# Patient Record
Sex: Female | Born: 2000
Health system: Southern US, Community
[De-identification: ages and names within clinical notes are randomized; demographics above are authoritative.]

## PROBLEM LIST (undated history)

## (undated) ENCOUNTER — Inpatient Hospital Stay (HOSPITAL_COMMUNITY): Payer: Self-pay

## (undated) ENCOUNTER — Ambulatory Visit: Admission: EM | Payer: Commercial Managed Care - PPO | Source: Home / Self Care

## (undated) DIAGNOSIS — G43909 Migraine, unspecified, not intractable, without status migrainosus: Secondary | ICD-10-CM

## (undated) DIAGNOSIS — J45909 Unspecified asthma, uncomplicated: Secondary | ICD-10-CM

## (undated) HISTORY — PX: TONSILLECTOMY: SUR1361

## (undated) HISTORY — PX: BREAST REDUCTION SURGERY: SHX8

## (undated) HISTORY — DX: Migraine, unspecified, not intractable, without status migrainosus: G43.909

## (undated) HISTORY — DX: Unspecified asthma, uncomplicated: J45.909

## (undated) HISTORY — PX: TYMPANOSTOMY TUBE PLACEMENT: SHX32

---
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
## 2019-01-24 DIAGNOSIS — F418 Other specified anxiety disorders: Secondary | ICD-10-CM | POA: Diagnosis not present

## 2019-02-17 ENCOUNTER — Other Ambulatory Visit: Payer: Self-pay | Admitting: Internal Medicine

## 2019-02-17 DIAGNOSIS — Z20822 Contact with and (suspected) exposure to covid-19: Secondary | ICD-10-CM

## 2019-02-19 LAB — NOVEL CORONAVIRUS, NAA: SARS-CoV-2, NAA: DETECTED — AB

## 2019-03-22 MED FILL — SERTRALINE HCL 100 MG TAB: 100 | 30 days supply | Qty: 30 | Fill #0

## 2019-04-04 ENCOUNTER — Encounter (HOSPITAL_COMMUNITY): Payer: Self-pay | Admitting: Emergency Medicine

## 2019-04-04 ENCOUNTER — Emergency Department (HOSPITAL_COMMUNITY)
Admission: EM | Admit: 2019-04-04 | Discharge: 2019-04-05 | Discharge status: Against Medical Advice | Payer: 59 | Attending: Emergency Medicine | Admitting: Emergency Medicine

## 2019-04-04 ENCOUNTER — Other Ambulatory Visit: Payer: Self-pay

## 2019-04-04 DIAGNOSIS — Z5321 Procedure and treatment not carried out due to patient leaving prior to being seen by health care provider: Secondary | ICD-10-CM | POA: Insufficient documentation

## 2019-04-04 DIAGNOSIS — R42 Dizziness and giddiness: Secondary | ICD-10-CM | POA: Diagnosis present

## 2019-04-04 LAB — CBC
HCT: 44 % (ref 36.0–46.0)
Hemoglobin: 12.7 g/dL (ref 12.0–15.0)
MCH: 23.5 pg — ABNORMAL LOW (ref 26.0–34.0)
MCHC: 28.9 g/dL — ABNORMAL LOW (ref 30.0–36.0)
MCV: 81.3 fL (ref 80.0–100.0)
Platelets: 283 10*3/uL (ref 150–400)
RBC: 5.41 MIL/uL — ABNORMAL HIGH (ref 3.87–5.11)
RDW: 13.9 % (ref 11.5–15.5)
WBC: 5.5 10*3/uL (ref 4.0–10.5)
nRBC: 0 % (ref 0.0–0.2)

## 2019-04-04 LAB — I-STAT BETA HCG BLOOD, ED (MC, WL, AP ONLY): I-stat hCG, quantitative: <5 m[IU]/mL (ref <5)

## 2019-04-04 LAB — URINALYSIS, ROUTINE W REFLEX MICROSCOPIC
Bilirubin Urine: NEGATIVE
Glucose, UA: NEGATIVE mg/dL
Hgb urine dipstick: NEGATIVE
Ketones, ur: NEGATIVE mg/dL
Nitrite: NEGATIVE
Protein, ur: NEGATIVE mg/dL
Specific Gravity, Urine: 1.028 (ref 1.005–1.030)
pH: 5 (ref 5.0–8.0)

## 2019-04-04 LAB — BASIC METABOLIC PANEL
Anion gap: 10 (ref 5–15)
BUN: 13 mg/dL (ref 6–20)
CO2: 23 mmol/L (ref 22–32)
Calcium: 9.5 mg/dL (ref 8.9–10.3)
Chloride: 107 mmol/L (ref 98–111)
Creatinine, Ser: 0.84 mg/dL (ref 0.44–1.00)
GFR calc Af Amer: >60 mL/min (ref >60)
GFR calc non Af Amer: >60 mL/min (ref >60)
Glucose, Bld: 88 mg/dL (ref 70–99)
Potassium: 3.7 mmol/L (ref 3.5–5.1)
Sodium: 140 mmol/L (ref 135–145)

## 2019-04-04 MED ORDER — SODIUM CHLORIDE 0.9% FLUSH: 3.0000 mL | Freq: Once | INTRAVENOUS | Status: DC

## 2019-04-04 NOTE — ED Triage Notes (Signed)
Pt states immediately upon waking up this morning she felt dizzy and since then it has been coming and going. Pt is concerned also because over the last couple months she has episodes where her vision goes black and last about 1 minute and then comes back.     Pt had covid 1 month ago.

## 2019-04-05 DIAGNOSIS — R42 Dizziness and giddiness: Secondary | ICD-10-CM | POA: Diagnosis not present

## 2019-04-05 DIAGNOSIS — E348 Other specified endocrine disorders: Secondary | ICD-10-CM | POA: Diagnosis not present

## 2019-04-10 ENCOUNTER — Other Ambulatory Visit: Payer: Self-pay

## 2019-04-10 ENCOUNTER — Other Ambulatory Visit: Payer: Self-pay | Admitting: Family Medicine

## 2019-04-10 DIAGNOSIS — Z20822 Contact with and (suspected) exposure to covid-19: Secondary | ICD-10-CM

## 2019-04-10 DIAGNOSIS — E348 Other specified endocrine disorders: Secondary | ICD-10-CM

## 2019-04-11 LAB — NOVEL CORONAVIRUS, NAA: SARS-CoV-2, NAA: NOT DETECTED

## 2019-04-27 ENCOUNTER — Ambulatory Visit
Admission: RE | Admit: 2019-04-27 | Discharge: 2019-04-27 | Disposition: A | Discharge status: Home / Self Care | Payer: 59 | Source: Ambulatory Visit | Attending: Family Medicine | Admitting: Family Medicine

## 2019-04-27 DIAGNOSIS — R519 Headache, unspecified: Secondary | ICD-10-CM | POA: Diagnosis not present

## 2019-04-27 DIAGNOSIS — E348 Other specified endocrine disorders: Secondary | ICD-10-CM

## 2019-04-27 MED ORDER — GADOBENATE DIMEGLUMINE 529 MG/ML IV SOLN
15.0000 mL | Freq: Once | INTRAVENOUS | Status: AC | PRN
  Administered 2019-04-27: 15 mL via INTRAVENOUS

## 2019-08-02 DIAGNOSIS — J069 Acute upper respiratory infection, unspecified: Secondary | ICD-10-CM | POA: Diagnosis not present

## 2019-08-03 DIAGNOSIS — Z1152 Encounter for screening for COVID-19: Secondary | ICD-10-CM | POA: Diagnosis not present

## 2019-08-03 DIAGNOSIS — J069 Acute upper respiratory infection, unspecified: Secondary | ICD-10-CM | POA: Diagnosis not present

## 2019-08-21 MED FILL — SERTRALINE HCL 100 MG TAB: 100 | 30 days supply | Qty: 30 | Fill #1

## 2019-09-12 DIAGNOSIS — E669 Obesity, unspecified: Secondary | ICD-10-CM | POA: Diagnosis not present

## 2019-09-12 DIAGNOSIS — F330 Major depressive disorder, recurrent, mild: Secondary | ICD-10-CM | POA: Diagnosis not present

## 2019-09-12 DIAGNOSIS — J040 Acute laryngitis: Secondary | ICD-10-CM | POA: Diagnosis not present

## 2019-10-06 DIAGNOSIS — J019 Acute sinusitis, unspecified: Secondary | ICD-10-CM | POA: Diagnosis not present

## 2019-10-06 MED FILL — ALBUTEROL SULFATE HFA 108 (: 108 (90 BAS | 17 days supply | Qty: 9 | Fill #0

## 2019-10-06 MED FILL — FLUTICASONE PROP 50 MCG SPR: 50 | 30 days supply | Qty: 16 | Fill #0

## 2019-10-11 MED FILL — AMOXICILLIN 875 MG TABS: 875 | 5 days supply | Qty: 10 | Fill #0

## 2019-11-02 ENCOUNTER — Ambulatory Visit: Payer: 59 | Attending: Internal Medicine

## 2019-11-02 DIAGNOSIS — Z20822 Contact with and (suspected) exposure to covid-19: Secondary | ICD-10-CM

## 2019-11-03 LAB — SARS-COV-2, NAA 2 DAY TAT

## 2019-11-03 LAB — NOVEL CORONAVIRUS, NAA: SARS-CoV-2, NAA: NOT DETECTED

## 2019-12-16 DIAGNOSIS — Z20822 Contact with and (suspected) exposure to covid-19: Secondary | ICD-10-CM | POA: Diagnosis not present

## 2020-01-19 ENCOUNTER — Other Ambulatory Visit: Payer: Self-pay

## 2020-01-19 ENCOUNTER — Other Ambulatory Visit: Payer: 59

## 2020-01-19 DIAGNOSIS — Z20822 Contact with and (suspected) exposure to covid-19: Secondary | ICD-10-CM

## 2020-01-22 LAB — NOVEL CORONAVIRUS, NAA: SARS-CoV-2, NAA: NOT DETECTED

## 2020-01-26 DIAGNOSIS — B373 Candidiasis of vulva and vagina: Secondary | ICD-10-CM | POA: Diagnosis not present

## 2020-01-26 MED FILL — FLUCONAZOLE 150 MG TABS: 150 | 6 days supply | Qty: 2 | Fill #0

## 2020-02-16 DIAGNOSIS — Z01419 Encounter for gynecological examination (general) (routine) without abnormal findings: Secondary | ICD-10-CM | POA: Diagnosis not present

## 2020-02-16 DIAGNOSIS — Z113 Encounter for screening for infections with a predominantly sexual mode of transmission: Secondary | ICD-10-CM | POA: Diagnosis not present

## 2020-02-16 DIAGNOSIS — Z3040 Encounter for surveillance of contraceptives, unspecified: Secondary | ICD-10-CM | POA: Diagnosis not present

## 2020-05-15 ENCOUNTER — Other Ambulatory Visit: Payer: 59

## 2020-05-15 DIAGNOSIS — Z20822 Contact with and (suspected) exposure to covid-19: Secondary | ICD-10-CM

## 2020-05-16 LAB — SARS-COV-2, NAA 2 DAY TAT

## 2020-05-16 LAB — NOVEL CORONAVIRUS, NAA: SARS-CoV-2, NAA: DETECTED — AB

## 2020-06-20 ENCOUNTER — Ambulatory Visit: Payer: 59 | Attending: Internal Medicine

## 2020-06-20 DIAGNOSIS — Z23 Encounter for immunization: Secondary | ICD-10-CM

## 2020-06-20 NOTE — Progress Notes (Signed)
   Covid-[facility] Vaccination [facility]  Name:  [name]    MRN: [id number] DOB: [birth year]  06/20/2020  [name] was observed post Covid-19 immunization for 15 minutes without incident. She was provided with Vaccine Information Sheet and instruction to access the V-Safe system.   [name] was instructed to call 911 with any severe reactions post vaccine: Marland Kitchen Difficulty breathing  . Swelling of face and throat  . A fast heartbeat  . A bad rash all over body  . Dizziness and weakness   Immunizations Administered    Name Date Dose VIS Date Route   Moderna COVID-19 Vaccine 06/20/2020  3:13 PM 0.5 mL 02/28/2020 Intramuscular   Manufacturer: Moderna   Lot: [id number]   NDC: 73532-992-42

## 2020-07-10 DIAGNOSIS — J029 Acute pharyngitis, unspecified: Secondary | ICD-10-CM | POA: Diagnosis not present

## 2020-07-10 DIAGNOSIS — R0981 Nasal congestion: Secondary | ICD-10-CM | POA: Diagnosis not present

## 2020-07-10 DIAGNOSIS — R059 Cough, unspecified: Secondary | ICD-10-CM | POA: Diagnosis not present

## 2020-07-10 DIAGNOSIS — R509 Fever, unspecified: Secondary | ICD-10-CM | POA: Diagnosis not present

## 2020-07-19 ENCOUNTER — Ambulatory Visit: Payer: 59

## 2020-08-14 ENCOUNTER — Other Ambulatory Visit (HOSPITAL_COMMUNITY): Payer: Self-pay

## 2020-08-14 DIAGNOSIS — Z3040 Encounter for surveillance of contraceptives, unspecified: Secondary | ICD-10-CM | POA: Diagnosis not present

## 2020-08-14 DIAGNOSIS — Z332 Encounter for elective termination of pregnancy: Secondary | ICD-10-CM | POA: Diagnosis not present

## 2020-08-14 MED ORDER — DROSPIRENONE-ETHINYL ESTRADIOL 3-0.02 MG PO TABS
1.0000 | ORAL_TABLET | Freq: Every day | ORAL | 4 refills | Status: DC
  Filled 2020-08-14 – 2020-09-05 (×2): qty 84, 84d supply, fill #0

## 2020-08-27 ENCOUNTER — Other Ambulatory Visit (HOSPITAL_COMMUNITY): Payer: Self-pay

## 2020-09-05 ENCOUNTER — Other Ambulatory Visit (HOSPITAL_COMMUNITY): Payer: Self-pay

## 2020-10-02 ENCOUNTER — Other Ambulatory Visit (HOSPITAL_COMMUNITY): Payer: Self-pay

## 2020-10-02 DIAGNOSIS — J069 Acute upper respiratory infection, unspecified: Secondary | ICD-10-CM | POA: Diagnosis not present

## 2020-10-02 DIAGNOSIS — F332 Major depressive disorder, recurrent severe without psychotic features: Secondary | ICD-10-CM | POA: Diagnosis not present

## 2020-10-02 MED ORDER — SERTRALINE HCL 100 MG PO TABS
ORAL_TABLET | Freq: Every day | ORAL | 4 refills | Status: DC
  Filled 2020-10-02: qty 90, 90d supply, fill #0

## 2020-11-21 ENCOUNTER — Other Ambulatory Visit (HOSPITAL_COMMUNITY): Payer: Self-pay

## 2020-11-21 DIAGNOSIS — F4320 Adjustment disorder, unspecified: Secondary | ICD-10-CM | POA: Diagnosis not present

## 2020-11-21 DIAGNOSIS — Z3040 Encounter for surveillance of contraceptives, unspecified: Secondary | ICD-10-CM | POA: Diagnosis not present

## 2020-11-21 MED ORDER — SERTRALINE HCL 100 MG PO TABS: 100.0000 mg | ORAL_TABLET | Freq: Every day | ORAL | 4 refills | Status: DC

## 2020-11-21 MED ORDER — DROSPIRENONE-ETHINYL ESTRADIOL 3-0.02 MG PO TABS
1.0000 | ORAL_TABLET | Freq: Every day | ORAL | 4 refills | Status: DC
  Filled 2020-11-21: qty 84, 84d supply, fill #0
  Filled 2021-05-19: qty 84, 84d supply, fill #1

## 2021-01-16 ENCOUNTER — Other Ambulatory Visit (HOSPITAL_COMMUNITY): Payer: Self-pay

## 2021-01-25 DIAGNOSIS — Z20822 Contact with and (suspected) exposure to covid-19: Secondary | ICD-10-CM | POA: Diagnosis not present

## 2021-02-04 ENCOUNTER — Other Ambulatory Visit (HOSPITAL_COMMUNITY): Payer: Self-pay

## 2021-02-04 ENCOUNTER — Encounter (HOSPITAL_BASED_OUTPATIENT_CLINIC_OR_DEPARTMENT_OTHER): Payer: Self-pay | Admitting: Emergency Medicine

## 2021-02-04 ENCOUNTER — Emergency Department (HOSPITAL_BASED_OUTPATIENT_CLINIC_OR_DEPARTMENT_OTHER): Payer: 59

## 2021-02-04 ENCOUNTER — Other Ambulatory Visit: Payer: Self-pay

## 2021-02-04 ENCOUNTER — Emergency Department (HOSPITAL_BASED_OUTPATIENT_CLINIC_OR_DEPARTMENT_OTHER)
Admission: EM | Admit: 2021-02-04 | Discharge: 2021-02-04 | Disposition: A | Discharge status: Home / Self Care | Payer: 59 | Attending: Emergency Medicine | Admitting: Emergency Medicine

## 2021-02-04 DIAGNOSIS — M542 Cervicalgia: Secondary | ICD-10-CM | POA: Insufficient documentation

## 2021-02-04 DIAGNOSIS — K760 Fatty (change of) liver, not elsewhere classified: Secondary | ICD-10-CM | POA: Diagnosis not present

## 2021-02-04 DIAGNOSIS — Y92410 Unspecified street and highway as the place of occurrence of the external cause: Secondary | ICD-10-CM | POA: Insufficient documentation

## 2021-02-04 DIAGNOSIS — R1032 Left lower quadrant pain: Secondary | ICD-10-CM | POA: Diagnosis not present

## 2021-02-04 DIAGNOSIS — R9431 Abnormal electrocardiogram [ECG] [EKG]: Secondary | ICD-10-CM | POA: Diagnosis not present

## 2021-02-04 DIAGNOSIS — S299XXA Unspecified injury of thorax, initial encounter: Secondary | ICD-10-CM | POA: Diagnosis not present

## 2021-02-04 DIAGNOSIS — R519 Headache, unspecified: Secondary | ICD-10-CM | POA: Insufficient documentation

## 2021-02-04 DIAGNOSIS — R0789 Other chest pain: Secondary | ICD-10-CM | POA: Insufficient documentation

## 2021-02-04 DIAGNOSIS — S3991XA Unspecified injury of abdomen, initial encounter: Secondary | ICD-10-CM | POA: Diagnosis not present

## 2021-02-04 DIAGNOSIS — S30811A Abrasion of abdominal wall, initial encounter: Secondary | ICD-10-CM | POA: Diagnosis not present

## 2021-02-04 DIAGNOSIS — S20219A Contusion of unspecified front wall of thorax, initial encounter: Secondary | ICD-10-CM | POA: Insufficient documentation

## 2021-02-04 DIAGNOSIS — M545 Low back pain, unspecified: Secondary | ICD-10-CM | POA: Insufficient documentation

## 2021-02-04 DIAGNOSIS — S27329A Contusion of lung, unspecified, initial encounter: Secondary | ICD-10-CM | POA: Insufficient documentation

## 2021-02-04 DIAGNOSIS — S27309A Unspecified injury of lung, unspecified, initial encounter: Secondary | ICD-10-CM | POA: Diagnosis present

## 2021-02-04 DIAGNOSIS — R918 Other nonspecific abnormal finding of lung field: Secondary | ICD-10-CM | POA: Diagnosis not present

## 2021-02-04 DIAGNOSIS — R109 Unspecified abdominal pain: Secondary | ICD-10-CM

## 2021-02-04 DIAGNOSIS — R0689 Other abnormalities of breathing: Secondary | ICD-10-CM | POA: Diagnosis not present

## 2021-02-04 DIAGNOSIS — S20212A Contusion of left front wall of thorax, initial encounter: Secondary | ICD-10-CM | POA: Diagnosis not present

## 2021-02-04 DIAGNOSIS — R079 Chest pain, unspecified: Secondary | ICD-10-CM | POA: Diagnosis not present

## 2021-02-04 LAB — CBC WITH DIFFERENTIAL/PLATELET
Abs Immature Granulocytes: 0.17 10*3/uL — ABNORMAL HIGH (ref 0.00–0.07)
Basophils Absolute: 0.1 10*3/uL (ref 0.0–0.1)
Basophils Relative: 0 %
Eosinophils Absolute: 0 10*3/uL (ref 0.0–0.5)
Eosinophils Relative: 0 %
HCT: 39.9 % (ref 36.0–46.0)
Hemoglobin: 12.3 g/dL (ref 12.0–15.0)
Immature Granulocytes: 1 %
Lymphocytes Relative: 6 %
Lymphs Abs: 1.4 10*3/uL (ref 0.7–4.0)
MCH: 23.7 pg — ABNORMAL LOW (ref 26.0–34.0)
MCHC: 30.8 g/dL (ref 30.0–36.0)
MCV: 76.9 fL — ABNORMAL LOW (ref 80.0–100.0)
Monocytes Absolute: 0.9 10*3/uL (ref 0.1–1.0)
Monocytes Relative: 4 %
Neutro Abs: 19.6 10*3/uL — ABNORMAL HIGH (ref 1.7–7.7)
Neutrophils Relative %: 89 %
Platelets: 338 10*3/uL (ref 150–400)
RBC: 5.19 MIL/uL — ABNORMAL HIGH (ref 3.87–5.11)
RDW: 13.6 % (ref 11.5–15.5)
WBC: 22.1 10*3/uL — ABNORMAL HIGH (ref 4.0–10.5)
nRBC: 0 % (ref 0.0–0.2)

## 2021-02-04 LAB — BASIC METABOLIC PANEL
Anion gap: 9 (ref 5–15)
BUN: 12 mg/dL (ref 6–20)
CO2: 21 mmol/L — ABNORMAL LOW (ref 22–32)
Calcium: 8.9 mg/dL (ref 8.9–10.3)
Chloride: 107 mmol/L (ref 98–111)
Creatinine, Ser: 0.69 mg/dL (ref 0.44–1.00)
GFR, Estimated: >60 mL/min (ref >60)
Glucose, Bld: 98 mg/dL (ref 70–99)
Potassium: 3.4 mmol/L — ABNORMAL LOW (ref 3.5–5.1)
Sodium: 137 mmol/L (ref 135–145)

## 2021-02-04 LAB — HCG, SERUM, QUALITATIVE: Preg, Serum: NEGATIVE

## 2021-02-04 MED ORDER — METHOCARBAMOL 500 MG PO TABS
500.0000 mg | ORAL_TABLET | Freq: Three times a day (TID) | ORAL | 0 refills | Status: DC | PRN
  Filled 2021-02-04: qty 20, 7d supply, fill #0

## 2021-02-04 MED ORDER — ONDANSETRON HCL 4 MG/2ML IJ SOLN
4.0000 mg | Freq: Once | INTRAMUSCULAR | Status: AC
  Administered 2021-02-04: 4 mg via INTRAVENOUS
  Filled 2021-02-04: qty 2

## 2021-02-04 MED ORDER — KETOROLAC TROMETHAMINE 15 MG/ML IJ SOLN
15.0000 mg | Freq: Once | INTRAMUSCULAR | Status: AC
  Administered 2021-02-04: 15 mg via INTRAVENOUS

## 2021-02-04 MED ORDER — IOHEXOL 350 MG/ML SOLN
100.0000 mL | Freq: Once | INTRAVENOUS | Status: AC | PRN
  Administered 2021-02-04: 85 mL via INTRAVENOUS

## 2021-02-04 MED ORDER — IBUPROFEN 600 MG PO TABS
600.0000 mg | ORAL_TABLET | Freq: Four times a day (QID) | ORAL | 0 refills | Status: DC | PRN
  Filled 2021-02-04: qty 30, 8d supply, fill #0

## 2021-02-04 MED ORDER — OXYCODONE-ACETAMINOPHEN 5-325 MG PO TABS
1.0000 | ORAL_TABLET | Freq: Four times a day (QID) | ORAL | 0 refills | Status: DC | PRN
  Filled 2021-02-04: qty 12, 3d supply, fill #0

## 2021-02-04 MED ORDER — FENTANYL CITRATE PF 50 MCG/ML IJ SOSY
50.0000 ug | PREFILLED_SYRINGE | Freq: Once | INTRAMUSCULAR | Status: AC
  Administered 2021-02-04: 50 ug via INTRAVENOUS
  Filled 2021-02-04: qty 1

## 2021-02-04 MED ORDER — KETOROLAC TROMETHAMINE 30 MG/ML IJ SOLN
30.0000 mg | Freq: Once | INTRAMUSCULAR | Status: DC
  Filled 2021-02-04: qty 1

## 2021-02-04 NOTE — ED Notes (Signed)
Taken to CT at this time. 

## 2021-02-04 NOTE — Discharge Instructions (Addendum)
Recommend recheck with your primary doctor in 24 to 48 hours.  Please use the incentive spirometer as instructed.  Recommend multimodal pain control as discussed.  Note the Percocet can make you drowsy should not be taken when driving or operating heavy machinery.  This also contains tylenol.  If you develop any worsening abdominal pain, any difficulty in breathing, come back to ER for reassessment.
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
## 2021-02-05 ENCOUNTER — Other Ambulatory Visit (HOSPITAL_COMMUNITY): Payer: Self-pay

## 2021-02-05 DIAGNOSIS — S20212D Contusion of left front wall of thorax, subsequent encounter: Secondary | ICD-10-CM | POA: Diagnosis not present

## 2021-02-05 DIAGNOSIS — S301XXD Contusion of abdominal wall, subsequent encounter: Secondary | ICD-10-CM | POA: Diagnosis not present

## 2021-02-05 DIAGNOSIS — F418 Other specified anxiety disorders: Secondary | ICD-10-CM | POA: Diagnosis not present

## 2021-02-05 DIAGNOSIS — S27329D Contusion of lung, unspecified, subsequent encounter: Secondary | ICD-10-CM | POA: Diagnosis not present

## 2021-02-05 DIAGNOSIS — S060X0D Concussion without loss of consciousness, subsequent encounter: Secondary | ICD-10-CM | POA: Diagnosis not present

## 2021-02-05 MED ORDER — BUSPIRONE HCL 10 MG PO TABS
10.0000 mg | ORAL_TABLET | Freq: Two times a day (BID) | ORAL | 2 refills | Status: DC | PRN
  Filled 2021-02-05: qty 60, 30d supply, fill #0

## 2021-02-14 DIAGNOSIS — R350 Frequency of micturition: Secondary | ICD-10-CM | POA: Diagnosis not present

## 2021-02-14 DIAGNOSIS — R1030 Lower abdominal pain, unspecified: Secondary | ICD-10-CM | POA: Diagnosis not present

## 2021-02-17 ENCOUNTER — Other Ambulatory Visit (HOSPITAL_COMMUNITY): Payer: Self-pay

## 2021-02-17 DIAGNOSIS — N939 Abnormal uterine and vaginal bleeding, unspecified: Secondary | ICD-10-CM | POA: Diagnosis not present

## 2021-02-17 DIAGNOSIS — R102 Pelvic and perineal pain: Secondary | ICD-10-CM | POA: Diagnosis not present

## 2021-02-17 DIAGNOSIS — Z3040 Encounter for surveillance of contraceptives, unspecified: Secondary | ICD-10-CM | POA: Diagnosis not present

## 2021-02-17 MED ORDER — DROSPIRENONE-ETHINYL ESTRADIOL 3-0.02 MG PO TABS
1.0000 | ORAL_TABLET | Freq: Every day | ORAL | 4 refills | Status: DC
  Filled 2021-02-17: qty 84, 84d supply, fill #0
  Filled 2021-08-11: qty 84, 84d supply, fill #1
  Filled 2021-10-27: qty 84, 84d supply, fill #2

## 2021-02-19 ENCOUNTER — Other Ambulatory Visit (HOSPITAL_COMMUNITY): Payer: Self-pay

## 2021-02-19 DIAGNOSIS — H6690 Otitis media, unspecified, unspecified ear: Secondary | ICD-10-CM | POA: Diagnosis not present

## 2021-02-19 DIAGNOSIS — N3000 Acute cystitis without hematuria: Secondary | ICD-10-CM | POA: Diagnosis not present

## 2021-02-19 DIAGNOSIS — Z03818 Encounter for observation for suspected exposure to other biological agents ruled out: Secondary | ICD-10-CM | POA: Diagnosis not present

## 2021-02-19 MED ORDER — NITROFURANTOIN MONOHYD MACRO 100 MG PO CAPS
100.0000 mg | ORAL_CAPSULE | Freq: Two times a day (BID) | ORAL | 0 refills | Status: DC
  Filled 2021-02-19: qty 10, 5d supply, fill #0

## 2021-02-19 MED ORDER — AMOXICILLIN-POT CLAVULANATE 875-125 MG PO TABS
1.0000 | ORAL_TABLET | Freq: Two times a day (BID) | ORAL | 0 refills | Status: DC
  Filled 2021-02-19: qty 20, 10d supply, fill #0

## 2021-04-08 ENCOUNTER — Other Ambulatory Visit (HOSPITAL_COMMUNITY): Payer: Self-pay

## 2021-04-08 DIAGNOSIS — R0981 Nasal congestion: Secondary | ICD-10-CM | POA: Diagnosis not present

## 2021-04-08 DIAGNOSIS — J45909 Unspecified asthma, uncomplicated: Secondary | ICD-10-CM | POA: Diagnosis not present

## 2021-04-08 DIAGNOSIS — R051 Acute cough: Secondary | ICD-10-CM | POA: Diagnosis not present

## 2021-04-08 DIAGNOSIS — R52 Pain, unspecified: Secondary | ICD-10-CM | POA: Diagnosis not present

## 2021-04-08 DIAGNOSIS — R6883 Chills (without fever): Secondary | ICD-10-CM | POA: Diagnosis not present

## 2021-04-08 DIAGNOSIS — Z03818 Encounter for observation for suspected exposure to other biological agents ruled out: Secondary | ICD-10-CM | POA: Diagnosis not present

## 2021-04-08 MED ORDER — AZITHROMYCIN 250 MG PO TABS
ORAL_TABLET | ORAL | 0 refills | Status: DC
  Filled 2021-04-08: qty 6, 5d supply, fill #0

## 2021-04-08 MED ORDER — PREDNISONE 5 MG PO TABS
ORAL_TABLET | ORAL | 0 refills | Status: DC
  Filled 2021-04-08: qty 21, 6d supply, fill #0

## 2021-05-19 ENCOUNTER — Other Ambulatory Visit (HOSPITAL_COMMUNITY): Payer: Self-pay

## 2021-08-11 ENCOUNTER — Other Ambulatory Visit (HOSPITAL_COMMUNITY): Payer: Self-pay

## 2021-09-08 ENCOUNTER — Other Ambulatory Visit (HOSPITAL_COMMUNITY): Payer: Self-pay

## 2021-09-30 DIAGNOSIS — Z3201 Encounter for pregnancy test, result positive: Secondary | ICD-10-CM | POA: Diagnosis not present

## 2021-09-30 DIAGNOSIS — Z3202 Encounter for pregnancy test, result negative: Secondary | ICD-10-CM | POA: Diagnosis not present

## 2021-09-30 DIAGNOSIS — N939 Abnormal uterine and vaginal bleeding, unspecified: Secondary | ICD-10-CM | POA: Diagnosis not present

## 2021-09-30 DIAGNOSIS — D649 Anemia, unspecified: Secondary | ICD-10-CM | POA: Diagnosis not present

## 2021-10-27 ENCOUNTER — Other Ambulatory Visit (HOSPITAL_COMMUNITY): Payer: Self-pay
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
## 2021-12-11 ENCOUNTER — Other Ambulatory Visit (HOSPITAL_COMMUNITY): Payer: Self-pay

## 2021-12-11 DIAGNOSIS — N898 Other specified noninflammatory disorders of vagina: Secondary | ICD-10-CM | POA: Diagnosis not present

## 2021-12-11 DIAGNOSIS — N751 Abscess of Bartholin's gland: Secondary | ICD-10-CM | POA: Insufficient documentation

## 2021-12-11 DIAGNOSIS — Z113 Encounter for screening for infections with a predominantly sexual mode of transmission: Secondary | ICD-10-CM | POA: Diagnosis not present

## 2021-12-11 DIAGNOSIS — N9489 Other specified conditions associated with female genital organs and menstrual cycle: Secondary | ICD-10-CM | POA: Diagnosis not present

## 2021-12-11 MED ORDER — SULFAMETHOXAZOLE-TRIMETHOPRIM 800-160 MG PO TABS
1.0000 | ORAL_TABLET | Freq: Two times a day (BID) | ORAL | 0 refills | Status: AC
  Filled 2021-12-11: qty 14, 7d supply, fill #0

## 2021-12-15 DIAGNOSIS — A549 Gonococcal infection, unspecified: Secondary | ICD-10-CM | POA: Insufficient documentation

## 2021-12-16 DIAGNOSIS — Z113 Encounter for screening for infections with a predominantly sexual mode of transmission: Secondary | ICD-10-CM | POA: Diagnosis not present

## 2022-01-20 ENCOUNTER — Other Ambulatory Visit (HOSPITAL_COMMUNITY): Payer: Self-pay

## 2022-01-20 DIAGNOSIS — N946 Dysmenorrhea, unspecified: Secondary | ICD-10-CM | POA: Diagnosis not present

## 2022-01-20 DIAGNOSIS — R300 Dysuria: Secondary | ICD-10-CM | POA: Diagnosis not present

## 2022-01-20 MED ORDER — NITROFURANTOIN MONOHYD MACRO 100 MG PO CAPS
100.0000 mg | ORAL_CAPSULE | Freq: Two times a day (BID) | ORAL | 0 refills | Status: AC
  Filled 2022-01-20: qty 10, 5d supply, fill #0

## 2022-01-21 DIAGNOSIS — N939 Abnormal uterine and vaginal bleeding, unspecified: Secondary | ICD-10-CM | POA: Diagnosis not present

## 2022-01-21 DIAGNOSIS — R300 Dysuria: Secondary | ICD-10-CM | POA: Diagnosis not present

## 2022-01-21 DIAGNOSIS — Z113 Encounter for screening for infections with a predominantly sexual mode of transmission: Secondary | ICD-10-CM | POA: Diagnosis not present

## 2022-01-26 DIAGNOSIS — O039 Complete or unspecified spontaneous abortion without complication: Secondary | ICD-10-CM | POA: Diagnosis not present

## 2022-01-26 DIAGNOSIS — N939 Abnormal uterine and vaginal bleeding, unspecified: Secondary | ICD-10-CM | POA: Diagnosis not present

## 2022-01-26 DIAGNOSIS — R102 Pelvic and perineal pain: Secondary | ICD-10-CM | POA: Diagnosis not present

## 2022-01-30 DIAGNOSIS — O039 Complete or unspecified spontaneous abortion without complication: Secondary | ICD-10-CM | POA: Diagnosis not present

## 2022-03-24 ENCOUNTER — Other Ambulatory Visit (HOSPITAL_COMMUNITY): Payer: Self-pay

## 2022-03-24 DIAGNOSIS — Z03818 Encounter for observation for suspected exposure to other biological agents ruled out: Secondary | ICD-10-CM | POA: Diagnosis not present

## 2022-03-24 DIAGNOSIS — R051 Acute cough: Secondary | ICD-10-CM | POA: Diagnosis not present

## 2022-03-24 DIAGNOSIS — J069 Acute upper respiratory infection, unspecified: Secondary | ICD-10-CM | POA: Diagnosis not present

## 2022-03-24 MED ORDER — ALBUTEROL SULFATE HFA 108 (90 BASE) MCG/ACT IN AERS
2.0000 | INHALATION_SPRAY | RESPIRATORY_TRACT | 0 refills | Status: DC
  Filled 2022-03-24: qty 6.7, 17d supply, fill #0

## 2022-03-24 MED ORDER — BENZONATATE 100 MG PO CAPS
100.0000 mg | ORAL_CAPSULE | Freq: Three times a day (TID) | ORAL | 0 refills | Status: DC
  Filled 2022-03-24: qty 15, 5d supply, fill #0

## 2022-03-25 ENCOUNTER — Other Ambulatory Visit (HOSPITAL_COMMUNITY): Payer: Self-pay

## 2022-03-25 MED ORDER — DOXYCYCLINE HYCLATE 100 MG PO CAPS
100.0000 mg | ORAL_CAPSULE | Freq: Two times a day (BID) | ORAL | 0 refills | Status: DC
  Filled 2022-03-25: qty 20, 10d supply, fill #0

## 2022-04-16 ENCOUNTER — Other Ambulatory Visit (HOSPITAL_COMMUNITY): Payer: Self-pay

## 2022-04-16 ENCOUNTER — Other Ambulatory Visit: Payer: Self-pay | Admitting: Physician Assistant

## 2022-04-16 DIAGNOSIS — J45909 Unspecified asthma, uncomplicated: Secondary | ICD-10-CM | POA: Diagnosis not present

## 2022-04-16 DIAGNOSIS — H538 Other visual disturbances: Secondary | ICD-10-CM | POA: Diagnosis not present

## 2022-04-16 DIAGNOSIS — R519 Headache, unspecified: Secondary | ICD-10-CM

## 2022-04-16 DIAGNOSIS — E348 Other specified endocrine disorders: Secondary | ICD-10-CM | POA: Diagnosis not present

## 2022-04-16 MED ORDER — PREDNISONE 5 MG PO TABS
ORAL_TABLET | ORAL | 0 refills | Status: DC
  Filled 2022-04-16: qty 21, 6d supply, fill #0

## 2022-09-03 DIAGNOSIS — R300 Dysuria: Secondary | ICD-10-CM | POA: Diagnosis not present

## 2022-09-03 DIAGNOSIS — Z113 Encounter for screening for infections with a predominantly sexual mode of transmission: Secondary | ICD-10-CM | POA: Diagnosis not present

## 2022-09-03 DIAGNOSIS — R102 Pelvic and perineal pain: Secondary | ICD-10-CM | POA: Diagnosis not present

## 2022-09-08 ENCOUNTER — Other Ambulatory Visit (HOSPITAL_COMMUNITY): Payer: Self-pay

## 2022-09-08 DIAGNOSIS — H6691 Otitis media, unspecified, right ear: Secondary | ICD-10-CM | POA: Diagnosis not present

## 2022-09-08 DIAGNOSIS — R5383 Other fatigue: Secondary | ICD-10-CM | POA: Diagnosis not present

## 2022-09-08 MED ORDER — AMOXICILLIN-POT CLAVULANATE 875-125 MG PO TABS
1.0000 | ORAL_TABLET | Freq: Two times a day (BID) | ORAL | 0 refills | Status: DC
  Filled 2022-09-08: qty 20, 10d supply, fill #0

## 2022-09-10 ENCOUNTER — Other Ambulatory Visit (HOSPITAL_COMMUNITY): Payer: Self-pay

## 2022-09-10 ENCOUNTER — Other Ambulatory Visit: Payer: Self-pay

## 2022-09-10 DIAGNOSIS — H6691 Otitis media, unspecified, right ear: Secondary | ICD-10-CM | POA: Diagnosis not present

## 2022-09-10 MED ORDER — PREDNISONE 5 MG (21) PO TBPK
ORAL_TABLET | ORAL | 0 refills | Status: DC
  Filled 2022-09-10: qty 21, 6d supply, fill #0

## 2022-09-10 MED ORDER — AZITHROMYCIN 250 MG PO TABS
ORAL_TABLET | ORAL | 0 refills | Status: DC
  Filled 2022-09-10: qty 6, 5d supply, fill #0

## 2022-09-15 DIAGNOSIS — H6691 Otitis media, unspecified, right ear: Secondary | ICD-10-CM | POA: Diagnosis not present

## 2022-09-17 DIAGNOSIS — N809 Endometriosis, unspecified: Secondary | ICD-10-CM | POA: Diagnosis not present

## 2022-09-17 DIAGNOSIS — R102 Pelvic and perineal pain: Secondary | ICD-10-CM | POA: Diagnosis not present

## 2022-09-17 DIAGNOSIS — N940 Mittelschmerz: Secondary | ICD-10-CM | POA: Diagnosis not present

## 2022-11-26 ENCOUNTER — Encounter: Payer: Self-pay | Admitting: Physician Assistant

## 2022-11-26 ENCOUNTER — Other Ambulatory Visit (HOSPITAL_COMMUNITY): Payer: Self-pay

## 2022-11-26 ENCOUNTER — Other Ambulatory Visit: Payer: Self-pay | Admitting: Internal Medicine

## 2022-11-26 ENCOUNTER — Ambulatory Visit
Admission: RE | Admit: 2022-11-26 | Discharge: 2022-11-26 | Disposition: A | Discharge status: Home / Self Care | Payer: Commercial Managed Care - PPO | Source: Ambulatory Visit | Attending: Internal Medicine | Admitting: Internal Medicine

## 2022-11-26 DIAGNOSIS — J069 Acute upper respiratory infection, unspecified: Secondary | ICD-10-CM

## 2022-11-26 DIAGNOSIS — Z03818 Encounter for observation for suspected exposure to other biological agents ruled out: Secondary | ICD-10-CM | POA: Diagnosis not present

## 2022-11-26 MED ORDER — ALBUTEROL SULFATE HFA 108 (90 BASE) MCG/ACT IN AERS
2.0000 | INHALATION_SPRAY | RESPIRATORY_TRACT | 0 refills | Status: DC | PRN
  Filled 2022-11-26: qty 6.7, 17d supply, fill #0

## 2022-11-26 MED ORDER — AMOXICILLIN-POT CLAVULANATE 875-125 MG PO TABS
1.0000 | ORAL_TABLET | Freq: Two times a day (BID) | ORAL | 0 refills | Status: AC
  Filled 2022-11-26: qty 14, 7d supply, fill #0

## 2022-11-26 MED ORDER — PROMETHAZINE HCL 6.25 MG/5ML PO SOLN
ORAL | 0 refills | Status: DC
  Filled 2022-11-26: qty 200, 5d supply, fill #0

## 2022-11-26 MED ORDER — BENZONATATE 200 MG PO CAPS
200.0000 mg | ORAL_CAPSULE | Freq: Three times a day (TID) | ORAL | 0 refills | Status: DC
  Filled 2022-11-26: qty 15, 5d supply, fill #0

## 2022-11-27 ENCOUNTER — Other Ambulatory Visit (HOSPITAL_COMMUNITY): Payer: Self-pay

## 2022-12-07 ENCOUNTER — Other Ambulatory Visit (HOSPITAL_COMMUNITY): Payer: Self-pay

## 2023-03-16 ENCOUNTER — Other Ambulatory Visit (HOSPITAL_COMMUNITY): Payer: Self-pay

## 2023-03-16 DIAGNOSIS — L03311 Cellulitis of abdominal wall: Secondary | ICD-10-CM | POA: Diagnosis not present

## 2023-03-16 MED ORDER — DOXYCYCLINE MONOHYDRATE 100 MG PO CAPS
100.0000 mg | ORAL_CAPSULE | Freq: Two times a day (BID) | ORAL | 0 refills | Status: AC
  Filled 2023-03-16: qty 10, 5d supply, fill #0

## 2023-04-12 ENCOUNTER — Other Ambulatory Visit (HOSPITAL_COMMUNITY): Payer: Self-pay

## 2023-04-12 MED ORDER — AMOXICILLIN 500 MG PO CAPS
500.0000 mg | ORAL_CAPSULE | Freq: Three times a day (TID) | ORAL | 0 refills | Status: DC
  Filled 2023-04-12: qty 21, 7d supply, fill #0

## 2023-04-12 MED ORDER — IBUPROFEN 800 MG PO TABS
800.0000 mg | ORAL_TABLET | Freq: Three times a day (TID) | ORAL | 0 refills | Status: DC
  Filled 2023-04-12: qty 15, 5d supply, fill #0

## 2023-04-15 ENCOUNTER — Other Ambulatory Visit (HOSPITAL_COMMUNITY): Payer: Self-pay

## 2023-04-15 DIAGNOSIS — R519 Headache, unspecified: Secondary | ICD-10-CM | POA: Diagnosis not present

## 2023-04-15 DIAGNOSIS — R509 Fever, unspecified: Secondary | ICD-10-CM | POA: Diagnosis not present

## 2023-04-15 DIAGNOSIS — J4531 Mild persistent asthma with (acute) exacerbation: Secondary | ICD-10-CM | POA: Diagnosis not present

## 2023-04-15 DIAGNOSIS — H6691 Otitis media, unspecified, right ear: Secondary | ICD-10-CM | POA: Diagnosis not present

## 2023-04-15 DIAGNOSIS — E348 Other specified endocrine disorders: Secondary | ICD-10-CM | POA: Diagnosis not present

## 2023-04-15 DIAGNOSIS — Z03818 Encounter for observation for suspected exposure to other biological agents ruled out: Secondary | ICD-10-CM | POA: Diagnosis not present

## 2023-04-15 DIAGNOSIS — R051 Acute cough: Secondary | ICD-10-CM | POA: Diagnosis not present

## 2023-04-15 MED ORDER — AZITHROMYCIN 250 MG PO TABS
ORAL_TABLET | ORAL | 0 refills | Status: AC
  Filled 2023-04-15: qty 6, 5d supply, fill #0

## 2023-04-15 MED ORDER — PROMETHAZINE-DM 6.25-15 MG/5ML PO SYRP
5.0000 mL | ORAL_SOLUTION | Freq: Four times a day (QID) | ORAL | 0 refills | Status: AC | PRN
  Filled 2023-04-15: qty 200, 10d supply, fill #0

## 2023-04-19 DIAGNOSIS — J4531 Mild persistent asthma with (acute) exacerbation: Secondary | ICD-10-CM | POA: Diagnosis not present

## 2023-04-21 ENCOUNTER — Other Ambulatory Visit (HOSPITAL_COMMUNITY): Payer: Self-pay

## 2023-04-21 MED ORDER — ALBUTEROL SULFATE (2.5 MG/3ML) 0.083% IN NEBU
2.5000 mg | INHALATION_SOLUTION | Freq: Four times a day (QID) | RESPIRATORY_TRACT | 1 refills | Status: DC | PRN
  Filled 2023-04-21 – 2023-05-11 (×2): qty 75, 7d supply, fill #0

## 2023-05-03 ENCOUNTER — Other Ambulatory Visit (HOSPITAL_COMMUNITY): Payer: Self-pay

## 2023-05-11 ENCOUNTER — Other Ambulatory Visit: Payer: Self-pay

## 2023-05-11 ENCOUNTER — Other Ambulatory Visit (HOSPITAL_COMMUNITY): Payer: Self-pay

## 2023-05-11 MED ORDER — PROMETHAZINE-DM 6.25-15 MG/5ML PO SYRP
5.0000 mL | ORAL_SOLUTION | Freq: Four times a day (QID) | ORAL | 0 refills | Status: DC | PRN
  Filled 2023-05-11: qty 200, 10d supply, fill #0

## 2023-05-13 ENCOUNTER — Other Ambulatory Visit (HOSPITAL_COMMUNITY): Payer: Self-pay

## 2023-05-13 ENCOUNTER — Ambulatory Visit: Payer: Self-pay

## 2023-05-13 DIAGNOSIS — Z20828 Contact with and (suspected) exposure to other viral communicable diseases: Secondary | ICD-10-CM | POA: Diagnosis not present

## 2023-05-13 DIAGNOSIS — R509 Fever, unspecified: Secondary | ICD-10-CM | POA: Diagnosis not present

## 2023-05-13 DIAGNOSIS — R062 Wheezing: Secondary | ICD-10-CM | POA: Diagnosis not present

## 2023-05-13 DIAGNOSIS — R197 Diarrhea, unspecified: Secondary | ICD-10-CM | POA: Diagnosis not present

## 2023-05-13 DIAGNOSIS — R112 Nausea with vomiting, unspecified: Secondary | ICD-10-CM | POA: Diagnosis not present

## 2023-05-13 DIAGNOSIS — R051 Acute cough: Secondary | ICD-10-CM | POA: Diagnosis not present

## 2023-05-13 DIAGNOSIS — Z03818 Encounter for observation for suspected exposure to other biological agents ruled out: Secondary | ICD-10-CM | POA: Diagnosis not present

## 2023-05-13 MED ORDER — OSELTAMIVIR PHOSPHATE 75 MG PO CAPS
75.0000 mg | ORAL_CAPSULE | Freq: Two times a day (BID) | ORAL | 0 refills | Status: DC
  Filled 2023-05-13: qty 10, 5d supply, fill #0

## 2023-11-08 ENCOUNTER — Other Ambulatory Visit (HOSPITAL_COMMUNITY): Payer: Self-pay

## 2023-11-08 DIAGNOSIS — R519 Headache, unspecified: Secondary | ICD-10-CM | POA: Diagnosis not present

## 2023-11-08 DIAGNOSIS — E348 Other specified endocrine disorders: Secondary | ICD-10-CM | POA: Diagnosis not present

## 2023-11-08 MED ORDER — SUMATRIPTAN SUCCINATE 50 MG PO TABS
50.0000 mg | ORAL_TABLET | ORAL | 3 refills | Status: AC | PRN
  Filled 2023-11-08: qty 18, 30d supply, fill #0

## 2023-11-09 ENCOUNTER — Other Ambulatory Visit: Payer: Self-pay | Admitting: Internal Medicine

## 2023-11-09 DIAGNOSIS — E348 Other specified endocrine disorders: Secondary | ICD-10-CM

## 2023-11-09 DIAGNOSIS — R519 Headache, unspecified: Secondary | ICD-10-CM

## 2023-12-07 ENCOUNTER — Other Ambulatory Visit (HOSPITAL_COMMUNITY): Payer: Self-pay

## 2023-12-07 DIAGNOSIS — R1110 Vomiting, unspecified: Secondary | ICD-10-CM | POA: Diagnosis not present

## 2023-12-07 DIAGNOSIS — N939 Abnormal uterine and vaginal bleeding, unspecified: Secondary | ICD-10-CM | POA: Diagnosis not present

## 2023-12-07 MED ORDER — ONDANSETRON HCL 4 MG PO TABS
4.0000 mg | ORAL_TABLET | Freq: Every day | ORAL | 0 refills | Status: DC
  Filled 2023-12-07: qty 10, 10d supply, fill #0

## 2023-12-08 ENCOUNTER — Other Ambulatory Visit (HOSPITAL_COMMUNITY): Payer: Self-pay | Admitting: Physician Assistant

## 2023-12-08 ENCOUNTER — Ambulatory Visit
Admission: RE | Admit: 2023-12-08 | Discharge: 2023-12-08 | Disposition: A | Discharge status: Home / Self Care | Source: Ambulatory Visit | Attending: Physician Assistant | Admitting: Physician Assistant

## 2023-12-08 DIAGNOSIS — N920 Excessive and frequent menstruation with regular cycle: Secondary | ICD-10-CM | POA: Diagnosis not present

## 2023-12-08 DIAGNOSIS — R1032 Left lower quadrant pain: Secondary | ICD-10-CM | POA: Diagnosis not present

## 2023-12-08 DIAGNOSIS — N939 Abnormal uterine and vaginal bleeding, unspecified: Secondary | ICD-10-CM

## 2023-12-08 MED ORDER — IOPAMIDOL (ISOVUE-300) INJECTION 61%
75.0000 mL | Freq: Once | INTRAVENOUS | Status: AC | PRN
Start: 2023-12-08 — End: 2023-12-08
  Administered 2023-12-08: 75 mL via INTRAVENOUS

## 2023-12-25 ENCOUNTER — Inpatient Hospital Stay: Admission: RE | Admit: 2023-12-25 | Source: Ambulatory Visit

## 2024-01-13 ENCOUNTER — Ambulatory Visit: Admitting: Physician Assistant

## 2024-01-13 ENCOUNTER — Encounter: Payer: Self-pay | Admitting: Physician Assistant

## 2024-01-13 ENCOUNTER — Other Ambulatory Visit (INDEPENDENT_AMBULATORY_CARE_PROVIDER_SITE_OTHER): Payer: Self-pay

## 2024-01-13 ENCOUNTER — Other Ambulatory Visit (HOSPITAL_COMMUNITY): Payer: Self-pay

## 2024-01-13 DIAGNOSIS — G8929 Other chronic pain: Secondary | ICD-10-CM

## 2024-01-13 DIAGNOSIS — M25562 Pain in left knee: Secondary | ICD-10-CM

## 2024-01-13 DIAGNOSIS — R102 Pelvic and perineal pain: Secondary | ICD-10-CM | POA: Diagnosis not present

## 2024-01-13 DIAGNOSIS — N939 Abnormal uterine and vaginal bleeding, unspecified: Secondary | ICD-10-CM | POA: Diagnosis not present

## 2024-01-13 DIAGNOSIS — N750 Cyst of Bartholin's gland: Secondary | ICD-10-CM | POA: Diagnosis not present

## 2024-01-13 DIAGNOSIS — Z3202 Encounter for pregnancy test, result negative: Secondary | ICD-10-CM | POA: Diagnosis not present

## 2024-01-13 DIAGNOSIS — N760 Acute vaginitis: Secondary | ICD-10-CM | POA: Diagnosis not present

## 2024-01-13 MED ORDER — MELOXICAM 15 MG PO TABS
15.0000 mg | ORAL_TABLET | Freq: Every day | ORAL | 0 refills | Status: DC
  Filled 2024-01-13: qty 30, 30d supply, fill #0

## 2024-01-13 NOTE — Progress Notes (Signed)
 Office Visit Note   Patient: Michaela Barron           Date of Birth: Dec 07, 2000           MRN: [id number] Visit Date: 01/13/2024              Requested by: [facility] [address], [address] [place],  [place] [address] PCP: [facility]   Assessment & Plan: Visit Diagnoses:  1. Chronic pain of left knee     Plan: Patient is a pleasant 23 year old woman who comes in today with a 10-day history of left knee pain.  She has difficulty extending it.  She says it is painful when walking shoots from the knee up to the thigh.  She feels like it is going to pop.  She has been taking ibuprofen  but not helping.  She has had no previous history of this.  Cannot appreciate a big effusion today her exam is overall normal.  Will place her on a regular anti-inflammatory she is not to take other anti-inflammatories with this.  Also would like for her to do some home directed exercises I gave her Thera-Band and taught her the exercises to do.  Will follow-up in 4 weeks if she is no better or things get worse before then would recommend an MRI  Follow-Up Instructions: Return in about 4 weeks (around 02/10/2024).   Orders:  Orders Placed This Encounter  Procedures   XR KNEE 3 VIEW LEFT   Meds ordered this encounter  Medications   meloxicam  (MOBIC ) 15 MG tablet    Sig: Take 1 tablet (15 mg total) by mouth daily.    Dispense:  30 tablet    Refill:  0      Procedures: No procedures performed   Clinical Data: No additional findings.   Subjective: No chief complaint on file.   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  Denies any fever chills denies any injury she says it shoots from the knee up to the thigh she feels it is painful when she is walking and extending it.  She has been taking ibuprofen  without much help  Review of Systems  All other  systems reviewed and are negative.    Objective: Vital Signs: There were no vitals taken for this visit.  Physical Exam Constitutional:      Appearance: Normal appearance.  Pulmonary:     Effort: Pulmonary effort is normal.  Skin:    General: Skin is warm and dry.  Neurological:     General: No focal deficit present.     Mental Status: She is alert and oriented to person, place, and time.  Psychiatric:        Mood and Affect: Mood normal.        Behavior: Behavior normal.     Ortho Exam Examination of her left knee compartments are soft and nontender she is neurovascularly intact she has good stability with anterior posterior drawer and varus valgus testing.  No particular tenderness over the joint line though she has hypermobility and with full extension it reproduces some pain especially in the back of her knee. Specialty Comments:  No specialty comments available.  Imaging: XR KNEE 3 VIEW LEFT Result Date: 01/13/2024 Radiographs of her left knee demonstrate no osseous lesions well-maintained alignment no degenerative changes    PMFS History: There are no active problems  to display for this patient.  History reviewed. No pertinent past medical history.  History reviewed. No pertinent family history.  Past Surgical History:  Procedure Laterality Date   BREAST REDUCTION SURGERY     Social History   Occupational History   Not on file  Tobacco Use   Smoking status: Never   Smokeless tobacco: Never  Substance and Sexual Activity   Alcohol use: Never   Drug use: Never   Sexual activity: Not on file

## 2024-01-13 NOTE — Progress Notes (Signed)
Xr left knee

## 2024-01-18 ENCOUNTER — Other Ambulatory Visit (HOSPITAL_COMMUNITY): Payer: Self-pay

## 2024-01-18 MED ORDER — METRONIDAZOLE 500 MG PO TABS
500.0000 mg | ORAL_TABLET | Freq: Two times a day (BID) | ORAL | 0 refills | Status: DC
  Filled 2024-01-18: qty 14, 7d supply, fill #0

## 2024-01-24 ENCOUNTER — Other Ambulatory Visit (HOSPITAL_COMMUNITY): Payer: Self-pay

## 2024-01-27 ENCOUNTER — Other Ambulatory Visit (HOSPITAL_COMMUNITY): Payer: Self-pay

## 2024-02-05 ENCOUNTER — Inpatient Hospital Stay: Admission: RE | Admit: 2024-02-05 | Source: Ambulatory Visit

## 2024-02-07 ENCOUNTER — Other Ambulatory Visit (HOSPITAL_COMMUNITY): Payer: Self-pay

## 2024-02-07 DIAGNOSIS — Z3201 Encounter for pregnancy test, result positive: Secondary | ICD-10-CM | POA: Diagnosis not present

## 2024-02-07 DIAGNOSIS — N926 Irregular menstruation, unspecified: Secondary | ICD-10-CM | POA: Diagnosis not present

## 2024-02-07 DIAGNOSIS — R102 Pelvic and perineal pain: Secondary | ICD-10-CM | POA: Diagnosis not present

## 2024-02-07 MED ORDER — ONDANSETRON 4 MG PO TBDP
4.0000 mg | ORAL_TABLET | Freq: Three times a day (TID) | ORAL | 0 refills | Status: AC
  Filled 2024-02-07: qty 30, 10d supply, fill #0

## 2024-02-14 ENCOUNTER — Other Ambulatory Visit (HOSPITAL_COMMUNITY): Payer: Self-pay

## 2024-02-14 MED ORDER — PROMETHAZINE HCL 25 MG PO TABS
25.0000 mg | ORAL_TABLET | Freq: Four times a day (QID) | ORAL | 1 refills | Status: AC | PRN
  Filled 2024-02-14: qty 30, 8d supply, fill #0

## 2024-02-15 ENCOUNTER — Encounter (HOSPITAL_COMMUNITY): Payer: Self-pay | Admitting: Obstetrics & Gynecology

## 2024-02-15 ENCOUNTER — Inpatient Hospital Stay (HOSPITAL_COMMUNITY)
Admission: AD | Admit: 2024-02-15 | Discharge: 2024-02-15 | Disposition: A | Discharge status: Home / Self Care | Attending: Obstetrics & Gynecology | Admitting: Obstetrics & Gynecology

## 2024-02-15 ENCOUNTER — Inpatient Hospital Stay (HOSPITAL_COMMUNITY)

## 2024-02-15 ENCOUNTER — Other Ambulatory Visit: Payer: Self-pay

## 2024-02-15 DIAGNOSIS — Z3A01 Less than 8 weeks gestation of pregnancy: Secondary | ICD-10-CM | POA: Diagnosis not present

## 2024-02-15 DIAGNOSIS — O3680X0 Pregnancy with inconclusive fetal viability, not applicable or unspecified: Secondary | ICD-10-CM | POA: Insufficient documentation

## 2024-02-15 DIAGNOSIS — O26891 Other specified pregnancy related conditions, first trimester: Secondary | ICD-10-CM | POA: Insufficient documentation

## 2024-02-15 DIAGNOSIS — O208 Other hemorrhage in early pregnancy: Secondary | ICD-10-CM | POA: Diagnosis not present

## 2024-02-15 DIAGNOSIS — O219 Vomiting of pregnancy, unspecified: Secondary | ICD-10-CM | POA: Insufficient documentation

## 2024-02-15 DIAGNOSIS — R1030 Lower abdominal pain, unspecified: Secondary | ICD-10-CM | POA: Diagnosis not present

## 2024-02-15 DIAGNOSIS — O209 Hemorrhage in early pregnancy, unspecified: Secondary | ICD-10-CM | POA: Diagnosis not present

## 2024-02-15 DIAGNOSIS — Z3A00 Weeks of gestation of pregnancy not specified: Secondary | ICD-10-CM | POA: Diagnosis not present

## 2024-02-15 LAB — CBC
HCT: 37 % (ref 36.0–46.0)
Hemoglobin: 11.2 g/dL — ABNORMAL LOW (ref 12.0–15.0)
MCH: 24.5 pg — ABNORMAL LOW (ref 26.0–34.0)
MCHC: 30.3 g/dL (ref 30.0–36.0)
MCV: 80.8 fL (ref 80.0–100.0)
Platelets: 273 K/uL (ref 150–400)
RBC: 4.58 MIL/uL (ref 3.87–5.11)
RDW: 13.5 % (ref 11.5–15.5)
WBC: 5.7 K/uL (ref 4.0–10.5)
nRBC: 0 % (ref 0.0–0.2)

## 2024-02-15 LAB — COMPREHENSIVE METABOLIC PANEL WITH GFR
ALT: 36 U/L (ref 0–44)
AST: 32 U/L (ref 15–41)
Albumin: 3.6 g/dL (ref 3.5–5.0)
Alkaline Phosphatase: 43 U/L (ref 38–126)
Anion gap: 8 (ref 5–15)
BUN: 7 mg/dL (ref 6–20)
CO2: 22 mmol/L (ref 22–32)
Calcium: 8.9 mg/dL (ref 8.9–10.3)
Chloride: 108 mmol/L (ref 98–111)
Creatinine, Ser: 0.75 mg/dL (ref 0.44–1.00)
GFR, Estimated: >60 mL/min (ref >60)
Glucose, Bld: 102 mg/dL — ABNORMAL HIGH (ref 70–99)
Potassium: 4 mmol/L (ref 3.5–5.1)
Sodium: 138 mmol/L (ref 135–145)
Total Bilirubin: 0.5 mg/dL (ref 0.0–1.2)
Total Protein: 6.5 g/dL (ref 6.5–8.1)

## 2024-02-15 LAB — URINALYSIS, ROUTINE W REFLEX MICROSCOPIC
Bilirubin Urine: NEGATIVE
Glucose, UA: NEGATIVE mg/dL
Ketones, ur: NEGATIVE mg/dL
Nitrite: NEGATIVE
Protein, ur: NEGATIVE mg/dL
Specific Gravity, Urine: 1.02 (ref 1.005–1.030)
pH: 6 (ref 5.0–8.0)

## 2024-02-15 LAB — WET PREP, GENITAL
Clue Cells Wet Prep HPF POC: NONE SEEN
Sperm: NONE SEEN
Trich, Wet Prep: NONE SEEN
WBC, Wet Prep HPF POC: <10 (ref <10)
Yeast Wet Prep HPF POC: NONE SEEN

## 2024-02-15 LAB — ABO/RH
ABO/RH(D): B NEG
Antibody Screen: NEGATIVE

## 2024-02-15 LAB — POCT PREGNANCY, URINE: Preg Test, Ur: POSITIVE — AB

## 2024-02-15 LAB — HCG, QUANTITATIVE, PREGNANCY: hCG, Beta Chain, Quant, S: 19487 m[IU]/mL — ABNORMAL HIGH (ref <5)

## 2024-02-15 MED ORDER — RHO D IMMUNE GLOBULIN 1500 UNIT/2ML IJ SOSY
300.0000 ug | PREFILLED_SYRINGE | Freq: Once | INTRAMUSCULAR | Status: DC
Start: 2024-02-15 — End: 2024-02-15
  Filled 2024-02-15: qty 2

## 2024-02-15 MED ORDER — RHO D IMMUNE GLOBULIN 1500 UNITS IM SOSY: 300.0000 ug | PREFILLED_SYRINGE | Freq: Once | INTRAMUSCULAR | Status: DC

## 2024-02-15 NOTE — MAU Note (Signed)
 Michaela Barron is a 23 y.o. at Unknown here in MAU reporting: she is approximately [redacted] weeks pregnant and had VB that began yesterday.  States the VB has slowed down and today it's very very light.  Denies recent intercourse.  Also reports is currently cramping.  States yesterday was dizzy and nauseous.  LMP: 01/03/2024 Onset of complaint: yesterday Pain score: 4 Vitals:   02/15/24 1158  BP: 115/68  Pulse: 63  Resp: 20  Temp: 98.3 F (36.8 C)  SpO2: 100%     FHT: NA  Lab orders placed from triage: UPT

## 2024-02-15 NOTE — Discharge Instructions (Addendum)
 You were seen for vaginal bleeding in early pregnancy  Your ultrasound showed a pregnancy inside your uterus.  It did not yet show a fetus with a heartbeat and this can be normal at this stage of pregnancy.  We would recommend a repeat ultrasound in 14 days to determine the viability of this pregnancy.    We discussed possible provision of RhoGAM given that you are Rh- blood type.  If you have heavier bleeding that soaks through more that 2 pads per hour for an hour of more If you bleed so much that you feel like your might pass out If you have significant abdominal pain that is not improved with Tylenol   If you develop a fever > 100.5

## 2024-02-15 NOTE — MAU Provider Note (Signed)
 History     CSN: [id number]  Arrival date and time: 02/15/24 1135   Event Date/Time   First Provider Initiated Contact with Patient 02/15/24 1258      Chief Complaint  Patient presents with   Vaginal Bleeding   Abdominal Pain   23 y.o. [id number] [redacted]w[redacted]d here with complaints of vaginal bleeding and cramping.  Home urine pregnancy test was positive. She reports she had heavy bleeding yesterday with gradual improvement today. Reports mild cramping that she rates at 4/10 and located in lower abdomen. On 10/6 had nausea with vomiting and had to stay home from work. Today 10/7 she was pumping gas and felt fluid and then noted bright red bleeding running down her legs. She filled up one pad. Denies clots.   UPT in triage positive.    Vaginal Bleeding Associated symptoms include abdominal pain. Pertinent negatives include no back pain, chills, diarrhea, dysuria, fever, flank pain, nausea, rash, sore throat or vomiting.  Abdominal Pain Pertinent negatives include no diarrhea, dysuria, fever, nausea or vomiting.    OB History     Gravida  3   Para      Term      Preterm      AB  2   Living         SAB      IAB      Ectopic      Multiple      Live Births              No past medical history on file.  Past Surgical History:  Procedure Laterality Date   BREAST REDUCTION SURGERY      No family history on file.  Social History   Tobacco Use   Smoking status: Never   Smokeless tobacco: Never  Substance Use Topics   Alcohol use: Never   Drug use: Never    Allergies:  Allergies  Allergen Reactions   Biotin Other (See Comments)   Clarithromycin Hives    Medications Prior to Admission  Medication Sig Dispense Refill Last Dose/Taking   albuterol  (PROVENTIL ) (2.5 MG/3ML) 0.083% nebulizer solution Inhale 3 mLs (2.5 mg total) by nebulization every 6 (six) hours as needed. 75 mL 1    albuterol  (VENTOLIN  HFA) 108 (90 Base) MCG/ACT inhaler Inhale 2 puffs into  the lungs every 4 (four) hours as needed 6.7 g 0    albuterol  (VENTOLIN  HFA) 108 (90 Base) MCG/ACT inhaler Inhale 2 puffs into the lungs every 4 (four) hours as needed. 6.7 g 0    amoxicillin  (AMOXIL ) 500 MG capsule Take 1 capsule (500 mg total) by mouth 3 (three) times daily until gone. 21 capsule 0    amoxicillin -clavulanate (AUGMENTIN ) 875-125 MG tablet Take 1 tablet by mouth every 12 (twelve) hours. 20 tablet 0    azithromycin  (ZITHROMAX ) 250 MG tablet Take 2 tablets on day1 then 1 tablet daily for 4 days 6 tablet 0    azithromycin  (ZITHROMAX ) 250 MG tablet Take 2 tablets on day 1, then 1 tablet daily days 2 through 5 6 tablet 0    benzonatate  (TESSALON ) 100 MG capsule Take 1 capsule (100 mg total) by mouth 3 (three) times daily as needed for 5 days 15 capsule 0    benzonatate  (TESSALON ) 200 MG capsule Take 1 capsule (200 mg total) by mouth 3 (three) times daily. 15 capsule 0    brompheniramine-pseudoephedrine-DM 30-2-10 MG/5ML syrup Take 10 mLs by mouth 3 (three) times daily as needed. 120 mL 0  busPIRone  (BUSPAR ) 10 MG tablet Take 1 tablet (10 mg total) by mouth 2 (two) times daily as needed. 60 tablet 2    doxycycline  (VIBRAMYCIN ) 100 MG capsule Take 1 capsule (100 mg total) by mouth 2 (two) times daily with food 20 capsule 0    drospirenone -ethinyl estradiol  (YAZ) 3-0.02 MG tablet Take 1 tablet by mouth daily. 84 tablet 4    drospirenone -ethinyl estradiol  (YAZ) 3-0.02 MG tablet Take 1 tablet by mouth daily. 84 tablet 4    drospirenone -ethinyl estradiol  (YAZ) 3-0.02 MG tablet Take 1 tablet by mouth daily. 84 tablet 4    fluticasone  (FLONASE ) 50 MCG/ACT nasal spray Place 2 sprays into both nostrils daily for 10 days. 16 g 0    ibuprofen  (ADVIL ) 600 MG tablet Take 1 tablet (600 mg total) by mouth every 6 (six) hours as needed. 30 tablet 0    ibuprofen  (ADVIL ) 800 MG tablet Take 1 tablet (800 mg total) by mouth every 8 (eight) hours with food as needed. 15 tablet 0    ibuprofen  (ADVIL ,MOTRIN )  200 MG tablet Take 200 mg by mouth every 6 (six) hours as needed.      meloxicam  (MOBIC ) 15 MG tablet Take 1 tablet (15 mg total) by mouth daily. 30 tablet 0    methocarbamol  (ROBAXIN ) 500 MG tablet Take 1 tablet (500 mg total) by mouth every 8 (eight) hours as needed for muscle spasms. 20 tablet 0    metroNIDAZOLE  (FLAGYL ) 500 MG tablet Take 1 tablet (500 mg total) by mouth every 12 (twelve) hours for 7 days. 14 tablet 0    NEOMYCIN-POLYMYXIN-HYDROCORTISONE (CORTISPORIN) 1 % SOLN OTIC solution       nitrofurantoin , macrocrystal-monohydrate, (MACROBID ) 100 MG capsule Take 1 capsule (100 mg total) by mouth 2 (two) times daily for 5 days 10 capsule 0    ondansetron  (ZOFRAN ) 4 MG tablet Take 1 tablet (4 mg total) by mouth daily. 10 tablet 0    ondansetron  (ZOFRAN -ODT) 4 MG disintegrating tablet Place 1 tablet (4 mg total) under the tongue 3 (three) times daily. 30 tablet 0    oseltamivir  (TAMIFLU ) 75 MG capsule Take 1 capsule (75 mg total) by mouth 2 (two) times daily for 5 days 10 capsule 0    oxyCODONE -acetaminophen  (PERCOCET/ROXICET) 5-325 MG tablet Take 1 tablet by mouth every 6 (six) hours as needed for severe pain. 12 tablet 0    predniSONE  (DELTASONE ) 5 MG tablet Take as directed 21 tablet 0    predniSONE  (DELTASONE ) 5 MG tablet Taper over 6 days as directed. 6-5-4-3-2-1 21 tablet 0    predniSONE  (STERAPRED UNI-PAK 21 TAB) 5 MG (21) TBPK tablet Take as directed for 6 days 21 tablet 0    promethazine  (PHENERGAN ) 25 MG tablet Take 1 tablet (25 mg total) by mouth every 6 (six) hours as needed for nausea or vomiting. 30 tablet 1    promethazine  (PHENERGAN ) 6.25 MG/5ML solution Take 10 mL by mouth every 6 hours for 5 days 200 mL 0    promethazine -dextromethorphan (PROMETHAZINE -DM) 6.25-15 MG/5ML syrup Take 5 mLs by mouth every 6 (six) hours as needed. 200 mL 0    sertraline  (ZOLOFT ) 100 MG tablet Take 1/2 tablet by mouth for 4 days, then take 1 tablet by mouth daily. 90 tablet 4    sertraline   (ZOLOFT ) 100 MG tablet Take 1 tablet (100 mg total) by mouth daily. 90 tablet 4    SUMAtriptan  (IMITREX ) 50 MG tablet Take 1 tablet (50 mg total) as needed, may take second  dose at least 2 hours after first dose, up to 4 tablets per day as needed. 30 tablet 3     Review of Systems  Constitutional:  Negative for chills and fever.  HENT:  Negative for congestion and sore throat.   Eyes:  Negative for pain and visual disturbance.  Respiratory:  Negative for cough, chest tightness and shortness of breath.   Cardiovascular:  Negative for chest pain.  Gastrointestinal:  Positive for abdominal pain. Negative for diarrhea, nausea and vomiting.  Endocrine: Negative for cold intolerance and heat intolerance.  Genitourinary:  Positive for vaginal bleeding. Negative for dysuria and flank pain.  Musculoskeletal:  Negative for back pain.  Skin:  Negative for rash.  Allergic/Immunologic: Negative for food allergies.  Neurological:  Negative for dizziness and light-headedness.  Psychiatric/Behavioral:  Negative for agitation.    Physical Exam   Blood pressure 115/68, pulse 63, temperature 98.3 F (36.8 C), temperature source Oral, resp. rate 20, height 5' (1.524 m), weight 80.4 kg, last menstrual period 01/03/2024, SpO2 100%.  Physical Exam Vitals and nursing note reviewed.  Constitutional:      General: She is not in acute distress.    Appearance: She is well-developed.  HENT:     Head: Normocephalic and atraumatic.  Eyes:     General: No scleral icterus.    Conjunctiva/sclera: Conjunctivae normal.  Cardiovascular:     Rate and Rhythm: Normal rate.  Pulmonary:     Effort: Pulmonary effort is normal.  Chest:     Chest wall: No tenderness.  Abdominal:     Palpations: Abdomen is soft.     Tenderness: There is no abdominal tenderness. There is no guarding or rebound.  Genitourinary:    Vagina: Normal.  Musculoskeletal:        General: Normal range of motion.     Cervical back: Normal  range of motion and neck supple.  Skin:    General: Skin is warm and dry.     Findings: No rash.  Neurological:     Mental Status: She is alert and oriented to person, place, and time.     MAU Course  Procedures  MDM: high  This patient presents to the ED for concern of   Chief Complaint  Patient presents with   Vaginal Bleeding   Abdominal Pain     These complains involves an extensive number of treatment options, and is a complaint that carries with it a high risk of complications and morbidity.  The differential diagnosis for  1.vaginal bleeding in early pregnancy INCLUDES threatened miscarriage, ectopic pregnancy (unless IUP confirmed), normal variant bleeding with live IUP-mostly likely subchorionic hemorrhage in this case. Most likely for this patient is [place] vs normal variant    Co morbidities that complicate the patient evaluation: No medical conditions   External records from outside source obtained and reviewed including Scanned media records, CareEverywhere, and Prenatal care records  I ordered, and personally interpreted labs.  The pertinent results include:   Results for orders placed or performed during the hospital encounter of 02/15/24 (from the past 24 hours)  Pregnancy, urine POC     Status: Abnormal   Collection Time: 02/15/24 12:12 PM  Result Value Ref Range   Preg Test, Ur POSITIVE (A) NEGATIVE  ABO/Rh     Status: None   Collection Time: 02/15/24  1:08 PM  Result Value Ref Range   ABO/RH(D) B NEG    Antibody Screen      NEG Performed at [facility]  [facility], [address], [place], [place] [address]   CBC     Status: Abnormal   Collection Time: 02/15/24  1:10 PM  Result Value Ref Range   WBC 5.7 4.0 - 10.5 K/uL   RBC 4.58 3.87 - 5.11 MIL/uL   Hemoglobin 11.2 (L) 12.0 - 15.0 g/dL   HCT 62.9 63.9 - 53.9 %   MCV 80.8 80.0 - 100.0 fL   MCH 24.5 (L) 26.0 - 34.0 pg   MCHC 30.3 30.0 - 36.0 g/dL   RDW 86.4 88.4 - 84.4 %   Platelets 273 150 - 400 K/uL    nRBC 0.0 0.0 - 0.2 %  hCG, quantitative, pregnancy     Status: Abnormal   Collection Time: 02/15/24  1:10 PM  Result Value Ref Range   hCG, Beta Chain, Quant, S 19,487 (H) <5 mIU/mL  Comprehensive metabolic panel     Status: Abnormal   Collection Time: 02/15/24  1:10 PM  Result Value Ref Range   Sodium 138 135 - 145 mmol/L   Potassium 4.0 3.5 - 5.1 mmol/L   Chloride 108 98 - 111 mmol/L   CO2 22 22 - 32 mmol/L   Glucose, Bld 102 (H) 70 - 99 mg/dL   BUN 7 6 - 20 mg/dL   Creatinine, Ser 9.24 0.44 - 1.00 mg/dL   Calcium 8.9 8.9 - 89.6 mg/dL   Total Protein 6.5 6.5 - 8.1 g/dL   Albumin 3.6 3.5 - 5.0 g/dL   AST 32 15 - 41 U/L   ALT 36 0 - 44 U/L   Alkaline Phosphatase 43 38 - 126 U/L   Total Bilirubin 0.5 0.0 - 1.2 mg/dL   GFR, Estimated >39 >39 mL/min   Anion gap 8 5 - 15  Urinalysis, Routine w reflex microscopic -Urine, Clean Catch     Status: Abnormal   Collection Time: 02/15/24  1:11 PM  Result Value Ref Range   Color, Urine YELLOW YELLOW   APPearance HAZY (A) CLEAR   Specific Gravity, Urine 1.020 1.005 - 1.030   pH 6.0 5.0 - 8.0   Glucose, UA NEGATIVE NEGATIVE mg/dL   Hgb urine dipstick MODERATE (A) NEGATIVE   Bilirubin Urine NEGATIVE NEGATIVE   Ketones, ur NEGATIVE NEGATIVE mg/dL   Protein, ur NEGATIVE NEGATIVE mg/dL   Nitrite NEGATIVE NEGATIVE   Leukocytes,Ua TRACE (A) NEGATIVE   RBC / HPF 0-5 0 - 5 RBC/hpf   WBC, UA 0-5 0 - 5 WBC/hpf   Bacteria, UA RARE (A) NONE SEEN   Squamous Epithelial / HPF 0-5 0 - 5 /HPF   Mucus PRESENT   Wet prep, genital     Status: None   Collection Time: 02/15/24  1:11 PM   Specimen: PATH Cytology Cervicovaginal Ancillary Only  Result Value Ref Range   Yeast Wet Prep HPF POC NONE SEEN NONE SEEN   Trich, Wet Prep NONE SEEN NONE SEEN   Clue Cells Wet Prep HPF POC NONE SEEN NONE SEEN   WBC, Wet Prep HPF POC <10 <10   Sperm NONE SEEN   Rh IG workup (includes ABO/Rh)     Status: None (Preliminary result)   Collection Time: 02/15/24  4:17  PM  Result Value Ref Range   Gestational Age(Wks)      6 Performed at [facility], [address], [place], [place] [address]    Unit Number [id number]    Blood Component Type RHIG    Unit division 00  Status of Unit ALLOCATED    Transfusion Status OK TO TRANSFUSE      Imaging Studies ordered:  I ordered imaging studies includingTransvaginal US  I independently visualized and interpreted imaging which showed IUP, no fetal pole seen.  I agree with the radiologist interpretation    Reevaluation of the patient after these medicines showed that the patient improved I have reviewed the patients home medicines and have made adjustments as needed   MAU Course:  4:15 PM Reviewed results with patient and dicussed rhogam  given Rh Neg. She would like to receive rhogam. She had this with prior SAB and TAB  4:30 PM RN went to to reviewed rhogam provision. Patient reports not being able to stay due to transportation. We again discussed rhogam and that it is not routinely recommended in early pregnancy bleeding. Reviewed that rhogam protects future pregnancies and not current pregnancy. She voiced understanding and would prefer to leave. She was given discharge paperwork  After the interventions noted above, I reevaluated the patient and found that they have :improved  Dispostion: discharged   Assessment and Plan   1. Vaginal bleeding affecting early pregnancy   2. [redacted] weeks gestation of pregnancy   3. Pregnancy with uncertain fetal viability, single or unspecified fetus    - Discharged stable condition - Viability US  scheduled for 10/22  - Reviewed first trimester precautions  Future Appointments  Date Time Provider Department Center  02/29/2024  8:55 AM [facility] US2 [facility]  03/10/2024 10:15 AM [facility] INTAKE [facility] None  03/13/2024  1:30 PM Constant, Winton, MD [facility] None    Suzen Octave [facility] 02/15/2024, 4:40 PM

## 2024-02-16 LAB — RH IG WORKUP (INCLUDES ABO/RH)
Gestational Age(Wks): 6
Unit division: 0

## 2024-02-16 LAB — GC/CHLAMYDIA PROBE AMP (~~LOC~~) NOT AT ARMC
Chlamydia: NEGATIVE
Comment: NEGATIVE
Comment: NORMAL
Neisseria Gonorrhea: NEGATIVE

## 2024-02-28 ENCOUNTER — Other Ambulatory Visit: Payer: Self-pay | Admitting: *Deleted

## 2024-02-28 DIAGNOSIS — O3680X0 Pregnancy with inconclusive fetal viability, not applicable or unspecified: Secondary | ICD-10-CM

## 2024-02-29 ENCOUNTER — Other Ambulatory Visit

## 2024-03-10 ENCOUNTER — Other Ambulatory Visit (INDEPENDENT_AMBULATORY_CARE_PROVIDER_SITE_OTHER): Payer: Self-pay

## 2024-03-10 ENCOUNTER — Ambulatory Visit: Admitting: *Deleted

## 2024-03-10 VITALS — BP 129/79 | HR 71 | Wt 177.8 lb

## 2024-03-10 DIAGNOSIS — Z3480 Encounter for supervision of other normal pregnancy, unspecified trimester: Secondary | ICD-10-CM | POA: Diagnosis not present

## 2024-03-10 DIAGNOSIS — O3680X0 Pregnancy with inconclusive fetal viability, not applicable or unspecified: Secondary | ICD-10-CM

## 2024-03-10 NOTE — Patient Instructions (Signed)

## 2024-03-10 NOTE — Progress Notes (Signed)
 New OB Intake  I connected with Michaela Barron  on 03/10/24 at 10:15 AM EDT by In Person Visit and verified that I am speaking with the correct person using two identifiers. Nurse is located at [facility] and pt is located at [place].  I discussed the limitations, risks, security and privacy concerns of performing an evaluation and management service by telephone and the availability of in person appointments. I also discussed with the patient that there may be a patient responsible charge related to this service. The patient expressed understanding and agreed to proceed.  I explained I am completing New OB Intake today. We discussed EDD of 10/16/24 based on US  at [redacted]w[redacted]d weeks. Pt is [id number]. I reviewed her allergies, medications and Medical/Surgical/OB history.    Patient Active Problem List   Diagnosis Date Noted   Supervision of other normal pregnancy, antepartum 03/10/2024     Concerns addressed today  Delivery Plans Plans to deliver at [facility]. Discussed the nature of our practice with multiple providers including residents and students as well as female and female providers. Due to the size of the practice, the delivering provider may not be the same as those providing prenatal care.   Patient is not interested in water birth.  MyChart/Babyscripts MyChart access verified. I explained pt will have some visits in office and some virtually. Babyscripts instructions given and order placed. Patient verifies receipt of registration text/e-mail. Account successfully created and app downloaded. If patient is a candidate for Optimized scheduling, add to sticky note.   Blood Pressure Cuff/Weight Scale Pt has access to BP cuff at home. Explained after first prenatal appt pt will check weekly and document in Babyscripts. Patient does not have weight scale; patient may purchase if they desire to track weight weekly in Babyscripts.  Anatomy US  Explained first scheduled US  will be around 19 weeks.  Anatomy US  scheduled for TBD at TBD.  Is patient a candidate for Babyscripts Optimization? Yes, patient accepted    First visit review I reviewed new OB appt with patient. Explained pt will be seen by Jorene Moats, PA at first visit. Discussed [place] genetic screening with patient. Requests Panorama and [employer].. Routine prenatal labs OB Urine collected at today's visit.   Last Pap No results found for: DIAGPAP  [name], RN 03/10/2024  11:24 AM

## 2024-03-12 ENCOUNTER — Ambulatory Visit: Payer: Self-pay | Admitting: Obstetrics and Gynecology

## 2024-03-12 LAB — CULTURE, OB URINE

## 2024-03-12 LAB — URINE CULTURE, OB REFLEX: Organism ID, Bacteria: NO GROWTH

## 2024-03-13 ENCOUNTER — Encounter: Payer: Self-pay | Admitting: Obstetrics and Gynecology

## 2024-03-13 ENCOUNTER — Encounter: Payer: Self-pay | Admitting: Radiology

## 2024-03-14 ENCOUNTER — Telehealth

## 2024-03-16 ENCOUNTER — Telehealth: Payer: Self-pay | Admitting: *Deleted

## 2024-03-16 NOTE — Telephone Encounter (Signed)
 RTC from after hours/ lunch phone call. Pt reported migraine HA and repeat N/V in early pregnancy to after hours nurse. No answer. Left VM and call back number. MyChart follow up message sent as well.

## 2024-03-20 ENCOUNTER — Inpatient Hospital Stay (HOSPITAL_COMMUNITY)
Admission: AD | Admit: 2024-03-20 | Discharge: 2024-03-20 | Disposition: A | Discharge status: Home / Self Care | Attending: Obstetrics and Gynecology | Admitting: Obstetrics and Gynecology

## 2024-03-20 ENCOUNTER — Other Ambulatory Visit: Payer: Self-pay

## 2024-03-20 DIAGNOSIS — Y990 Civilian activity done for income or pay: Secondary | ICD-10-CM | POA: Insufficient documentation

## 2024-03-20 DIAGNOSIS — O26891 Other specified pregnancy related conditions, first trimester: Secondary | ICD-10-CM

## 2024-03-20 DIAGNOSIS — Z3480 Encounter for supervision of other normal pregnancy, unspecified trimester: Secondary | ICD-10-CM

## 2024-03-20 DIAGNOSIS — Z3A10 10 weeks gestation of pregnancy: Secondary | ICD-10-CM | POA: Diagnosis not present

## 2024-03-20 DIAGNOSIS — O9A213 Injury, poisoning and certain other consequences of external causes complicating pregnancy, third trimester: Secondary | ICD-10-CM | POA: Diagnosis not present

## 2024-03-20 DIAGNOSIS — W460XXA Contact with hypodermic needle, initial encounter: Secondary | ICD-10-CM

## 2024-03-20 NOTE — Discharge Instructions (Signed)
 You were seen in the MAU after a needlestick injury involving the medication Librela. Unfortunately we do not have a lot of information on this veterinary drug or its effect on humans. Based on what happened your exposure to the medicine is likely very small. We recommend you to continue with your regular prenatal appointments and scheduled ultrasounds.

## 2024-03-20 NOTE — MAU Note (Signed)
 Michaela Barron is a 23 y.o. at [redacted]w[redacted]d here in MAU reporting: getting stuck with a dirty needle at her vet office- medication in the needle was Librela- puncture did bleed. 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

## 2024-03-20 NOTE — MAU Provider Note (Signed)
 History     [id number]  Arrival date and time: 03/20/24 1746    Chief Complaint  Patient presents with   needle puncture at vet office    Left thumb     HPI Michaela Barron is a 23 y.o. at [redacted]w[redacted]d by 8 wk US , who presents for accidental needle stick.   Was at work at [employer] injecting a dog with [employer] Per review of literature it is a monoclonal antibody against nerve growth factor About half of the solution went in when the dog jumped The needle came out, she went to reinsert and the dog jumped again and she accidentally stuck herself Was purely a poke, was not holding plunger and doesn't think any of the solution was injected into her It did bleed but now is fine, demonstrates spot on finger They called the manufacturer and were told possible risks include allergic reaction and possible harm to the baby Currently denies vaginal bleeding, leaking fluid, abdominal pain   --/--/B NEG (10/07 1308)  OB History     Gravida  3   Para  0   Term  0   Preterm  0   AB  2   Living  0      SAB  1   IAB  1   Ectopic  0   Multiple  0   Live Births  0           Past Medical History:  Diagnosis Date   Asthma    Migraine     Past Surgical History:  Procedure Laterality Date   BREAST REDUCTION SURGERY     TONSILLECTOMY     TYMPANOSTOMY TUBE PLACEMENT      Family History  Problem Relation Age of Onset   Hypertension Mother    Heart Problems Mother    Cancer Father        larynx    Social History   Socioeconomic History   Marital status: Single    Spouse name: Not on file   Number of children: Not on file   Years of education: Not on file   Highest education level: Not on file  Occupational History   Not on file  Tobacco Use   Smoking status: Never   Smokeless tobacco: Never  Vaping Use   Vaping status: Former  Substance and Sexual Activity   Alcohol use: Not Currently   Drug use: Never   Sexual activity: Yes  Other Topics Concern    Not on file  Social History Narrative   Not on file   Social Drivers of Health   Financial Resource Strain: Not on file  Food Insecurity: Not on file  Transportation Needs: Not on file  Physical Activity: Not on file  Stress: Not on file  Social Connections: Not on file  Intimate Partner Violence: Not on file    Allergies  Allergen Reactions   Clarithromycin Hives   Shellfish Protein-Containing Drug Products Swelling    No current facility-administered medications on file prior to encounter.   Current Outpatient Medications on File Prior to Encounter  Medication Sig Dispense Refill   ondansetron  (ZOFRAN ) 4 MG tablet Take 1 tablet (4 mg total) by mouth daily. 10 tablet 0   ondansetron  (ZOFRAN -ODT) 4 MG disintegrating tablet Place 1 tablet (4 mg total) under the tongue 3 (three) times daily. 30 tablet 0   promethazine  (PHENERGAN ) 25 MG tablet Take 1 tablet (25 mg total) by mouth every 6 (six) hours as needed  for nausea or vomiting. 30 tablet 1   albuterol  (VENTOLIN  HFA) 108 (90 Base) MCG/ACT inhaler Inhale 2 puffs into the lungs every 4 (four) hours as needed 6.7 g 0   fluticasone  (FLONASE ) 50 MCG/ACT nasal spray Place 2 sprays into both nostrils daily for 10 days. 16 g 0   SUMAtriptan  (IMITREX ) 50 MG tablet Take 1 tablet (50 mg total) as needed, may take second dose at least 2 hours after first dose, up to 4 tablets per day as needed. (Patient not taking: Reported on 03/10/2024) 30 tablet 3     ROS Pertinent positives and negative per HPI, all others reviewed and negative  Physical Exam   BP (!) 123/59 (BP Location: Right Arm)   Pulse 70   Temp 99 F (37.2 C) (Oral)   Resp 18   Ht 5' (1.524 m)   Wt 81.8 kg   LMP 01/03/2024 (Approximate)   SpO2 100%   BMI 35.21 kg/m   Patient Vitals for the past 24 hrs:  BP Temp Temp src Pulse Resp SpO2 Height Weight  03/20/24 1837 (!) 123/59 99 F (37.2 C) Oral 70 18 100 % 5' (1.524 m) 81.8 kg    Physical Exam Vitals  reviewed.  Constitutional:      General: She is not in acute distress.    Appearance: She is well-developed. She is not diaphoretic.  Eyes:     General: No scleral icterus. Pulmonary:     Effort: Pulmonary effort is normal. No respiratory distress.  Skin:    General: Skin is warm and dry.  Neurological:     Mental Status: She is alert.     Coordination: Coordination normal.      Cervical Exam    Bedside Ultrasound Not performed.  My interpretation: n/a   Labs No results found for this or any previous visit (from the past 24 hours).  Imaging No results found.  MAU Course  Procedures Lab Orders  No laboratory test(s) ordered today   No orders of the defined types were placed in this encounter.  Imaging Orders  No imaging studies ordered today    MDM Moderate (Level 3-4)  Assessment and Plan  #Needlestick injury #[redacted] weeks gestation of pregnancy Literature reviewed, unfortunately there is very little to guide is in that arena as this is a vet medication not used in humans. The insert for the medicine notes possibility of allergic reactions and reproductive harms but no details. I could only find one study of mice that were injected with anti-NGF antibodies that demonstrated an increased rate of SAB but no mention of other defects. Regardless I reviewed with the patient that given the minor nature of the poke and that no significant amount was injected her overall exposure is likely very very low and that her overall risk is very very low but we have no way to mitigate these risks regardless. Also low risk for any sort of zoonotic infection from the dog. All questions answered, encouraged patient to f/u with routine prenatal care.    Dispo: discharged to home in stable condition    Donnice CHRISTELLA Carolus, MD/MPH 03/20/24 7:20 PM  Allergies as of 03/20/2024       Reactions   Clarithromycin Hives   Shellfish Protein-containing Drug Products Swelling         Medication List     TAKE these medications    albuterol  108 (90 Base) MCG/ACT inhaler Commonly known as: VENTOLIN  HFA Inhale 2 puffs into the lungs every  4 (four) hours as needed   fluticasone  50 MCG/ACT nasal spray Commonly known as: FLONASE  Place 2 sprays into both nostrils daily for 10 days.   ondansetron  4 MG disintegrating tablet Commonly known as: ZOFRAN -ODT Place 1 tablet (4 mg total) under the tongue 3 (three) times daily.   ondansetron  4 MG tablet Commonly known as: ZOFRAN  Take 1 tablet (4 mg total) by mouth daily.   promethazine  25 MG tablet Commonly known as: PHENERGAN  Take 1 tablet (25 mg total) by mouth every 6 (six) hours as needed for nausea or vomiting.   SUMAtriptan  50 MG tablet Commonly known as: IMITREX  Take 1 tablet (50 mg total) as needed, may take second dose at least 2 hours after first dose, up to 4 tablets per day as needed.

## 2024-03-21 ENCOUNTER — Other Ambulatory Visit (HOSPITAL_COMMUNITY): Payer: Self-pay

## 2024-03-21 ENCOUNTER — Encounter: Admitting: Family Medicine

## 2024-03-21 ENCOUNTER — Other Ambulatory Visit: Payer: Self-pay

## 2024-03-21 MED ORDER — DOXYLAMINE-PYRIDOXINE 10-10 MG PO TBEC
2.0000 | DELAYED_RELEASE_TABLET | Freq: Every day | ORAL | 5 refills | Status: AC
  Filled 2024-03-21: qty 100, 25d supply, fill #0
  Filled 2024-04-03: qty 100, 50d supply, fill #0

## 2024-03-31 ENCOUNTER — Encounter: Admitting: Physician Assistant

## 2024-04-03 ENCOUNTER — Other Ambulatory Visit (HOSPITAL_COMMUNITY): Payer: Self-pay

## 2024-04-05 ENCOUNTER — Encounter: Payer: Self-pay | Admitting: Obstetrics and Gynecology

## 2024-04-05 ENCOUNTER — Ambulatory Visit: Admitting: Obstetrics and Gynecology

## 2024-04-05 VITALS — BP 121/80 | HR 68 | Wt 177.2 lb

## 2024-04-05 DIAGNOSIS — Z3480 Encounter for supervision of other normal pregnancy, unspecified trimester: Secondary | ICD-10-CM

## 2024-04-05 DIAGNOSIS — Z3A12 12 weeks gestation of pregnancy: Secondary | ICD-10-CM

## 2024-04-05 DIAGNOSIS — Z131 Encounter for screening for diabetes mellitus: Secondary | ICD-10-CM | POA: Diagnosis not present

## 2024-04-05 DIAGNOSIS — O99210 Obesity complicating pregnancy, unspecified trimester: Secondary | ICD-10-CM | POA: Insufficient documentation

## 2024-04-05 DIAGNOSIS — O26899 Other specified pregnancy related conditions, unspecified trimester: Secondary | ICD-10-CM | POA: Insufficient documentation

## 2024-04-05 DIAGNOSIS — Z3481 Encounter for supervision of other normal pregnancy, first trimester: Secondary | ICD-10-CM | POA: Diagnosis not present

## 2024-04-05 NOTE — Progress Notes (Signed)
  Subjective:    Michaela Barron is a [id number] [redacted]w[redacted]d being seen today for her first obstetrical visit.  Her obstetrical history is significant for obesity. Patient also has chronic migraines and was previously followed by a neurologist who she has not seen in years. Patient also reports dizziness and near syncopal episodes. She often misses work as a oncologist. Patient does intend to breast feed. Pregnancy history fully reviewed.   Vitals:   04/05/24 0835  BP: 121/80  Pulse: 68  Weight: 177 lb 3.2 oz (80.4 kg)    HISTORY: OB History  Gravida Para Term Preterm AB Living  3 0 0 0 2 0  SAB IAB Ectopic Multiple Live Births  1 1 0 0 0    # Outcome Date GA Lbr Len/2nd Weight Sex Type Anes PTL Lv  3 Current           2 SAB      SAB     1 IAB      TAB      Past Medical History:  Diagnosis Date   Asthma    Migraine    Past Surgical History:  Procedure Laterality Date   BREAST REDUCTION SURGERY     TONSILLECTOMY     TYMPANOSTOMY TUBE PLACEMENT     Family History  Problem Relation Age of Onset   Hypertension Mother    Heart Problems Mother    Cancer Father        larynx     Exam    Uterus:   12-weeks  Pelvic Exam:    Perineum: No Hemorrhoids, Normal Perineum   Vulva: normal   Vagina:  normal mucosa, normal discharge, enlarged left bartholin gland, non tender   pH:    Cervix: nulliparous appearance and closed and long   Adnexa: no mass, fullness, tenderness   Bony Pelvis: gynecoid  System: Breast:  normal appearance, no masses or tenderness   Skin: normal coloration and turgor, no rashes    Neurologic: oriented, no focal deficits   Extremities: normal strength, tone, and muscle mass   HEENT extra ocular movement intact   Mouth/Teeth mucous membranes moist, pharynx normal without lesions and dental hygiene good   Neck supple and no masses   Cardiovascular: regular rate and rhythm   Respiratory:  appears well, vitals normal, no respiratory distress, acyanotic, normal  RR, chest clear, no wheezing, crepitations, rhonchi, normal symmetric air entry   Abdomen: soft, non-tender; bowel sounds normal; no masses,  no organomegaly   Urinary:       Assessment:    Pregnancy: [id number] Patient Active Problem List   Diagnosis Date Noted   Maternal obesity syndrome, antepartum 04/05/2024   Accidental hypodermic needlestick injury 03/20/2024   Supervision of other normal pregnancy, antepartum 03/10/2024        Plan:     Initial labs drawn. Prenatal vitamins. Problem list reviewed and updated. Genetic Screening discussed : ordered.  Ultrasound discussed; fetal survey: ordered. Referral for neurology and cardiology placed  Follow up in 4 weeks. 50% of 30 min visit spent on counseling and coordination of care.     [name] 04/05/2024

## 2024-04-05 NOTE — Patient Instructions (Signed)
 Second Trimester of Pregnancy  The second trimester of pregnancy is from week 14 through week 27. This is months 4 through 6 of pregnancy. During the second trimester: Morning sickness is less or has stopped. You may have more energy. You may feel hungry more often. At this time, your unborn baby is growing very fast. At the end of the sixth month, the unborn baby may be up to 12 inches long and weigh about 1 pounds. You will likely start to feel the baby move between 16 and 20 weeks of pregnancy. Body changes during your second trimester Your body continues to change during this time. The changes usually go away after your baby is born. Physical changes You will gain more weight. Your belly will get bigger. You may begin to get stretch marks on your hips, belly, and breasts. Your breasts will keep growing and may hurt. You may get dark spots or blotches on your face. A dark line from your belly button to the pubic area may appear. This line is called linea nigra. Your hair may grow faster and get thicker. Health changes You may have headaches. You may have heartburn. You may pee more often. You may have swollen, bulging veins (varicose veins). You may have trouble pooping (constipation), or swollen veins in the butt that can itch or get painful (hemorrhoids). You may have back pain. This is caused by: Weight gain. Pregnancy hormones that are relaxing the joints in your pelvis. Follow these instructions at home: Medicines Talk to your health care provider if you're taking medicines. Ask if the medicines are safe to take during pregnancy. Your provider may change the medicines that you take. Do not take any medicines unless told to by your provider. Take a prenatal vitamin that has at least 600 micrograms (mcg) of folic acid. Do not use herbal medicines, illegal drugs, or medicines that are not approved by your provider. Eating and drinking While you're pregnant your body needs  extra food for your growing baby. Talk with your provider about what to eat while pregnant. Activity Most women are able to exercise during pregnancy. Exercises may need to change as your pregnancy goes on. Talk to your provider about your activities and exercise routines. Relieving pain and discomfort Wear a good, supportive bra if your breasts hurt. Rest with your legs raised if you have leg cramps or low back pain. Take warm sitz baths to soothe pain from hemorrhoids. Use hemorrhoid cream if your provider says it's okay. Do not douche. Do not use tampons or scented pads. Do not use hot tubs, steam rooms, or saunas. Safety Wear your seatbelt at all times when you're in a car. Talk to your provider if someone hits you, hurts you, or yells at you. Talk with your provider if you're feeling sad or have thoughts of hurting yourself. Lifestyle Certain things can be harmful while you're pregnant. It's best to avoid the following: Do not drink alcohol,smoke, vape, or use products with nicotine or tobacco in them. If you need help quitting, talk with your provider. Avoid cat litter boxes and soil used by cats. These things carry germs that can cause harm to your pregnancy and your baby. General instructions Keep all follow-up visits. It helps you and your unborn baby stay as healthy as possible. Write down your questions. Take them to your prenatal visits. Your provider will: Talk with you about your overall health. Give you advice or refer you to specialists who can help with different needs,  including: Prenatal education classes. Mental health and counseling. Foods and healthy eating. Ask for help if you need help with food. Where to find more information American Pregnancy Association: americanpregnancy.org [employer]: acog.org Office on [employer] Health: [employer] Contact a health care provider if: You have a headache that does not go away  when you take medicine. You have any of these problems: You can't eat or drink. You throw up or feel like you may throw up. You have watery poop (diarrhea) for 2 days or more. You have pain when you pee or your pee smells bad. You have been sick for 2 days or more and are not getting better. Contact your provider right away if: You have any of these coming from your vagina: Abnormal discharge. Bad-smelling fluid. Bleeding. Your baby is moving less than usual. You have contractions, belly cramping, or have pain in your pelvis or lower back. You have symptoms of high blood pressure or preeclampsia. These include: A severe, throbbing headache that does not go away. Sudden or extreme swelling of your face, hands, legs, or feet. Vision problems: You see spots. You have blurry vision. Your eyes are sensitive to light. If you can't reach the provider, go to an urgent care or emergency room. Get help right away if: You faint, become confused, or can't think clearly. You have chest pain or trouble breathing. You have any kind of injury, such as from a fall or a car crash. These symptoms may be an emergency. Call 911 right away. Do not wait to see if the symptoms will go away. Do not drive yourself to the hospital. This information is not intended to replace advice given to you by your health care provider. Make sure you discuss any questions you have with your health care provider. Document Revised: 01/28/2023 Document Reviewed: 08/28/2022 Elsevier Patient Education  2024 [employer].

## 2024-04-05 NOTE — Progress Notes (Signed)
 New OB Pap, OB Panel, Hgb A1C, Panorama and Horizon Reports extreme dizziness in AM that sometimes lasts throughout the day. Has vomiting in the AM. Can keep down food and fluids after that. Reports drinking a lot of water. Reports HA's 3-4 X week. Sometimes they are migraines, sometimes regular headaches.

## 2024-04-07 LAB — CBC/D/PLT+RPR+RH+ABO+RUBIGG...
Antibody Screen: NEGATIVE
Basophils Absolute: 0 x10E3/uL (ref 0.0–0.2)
Basos: 0 %
EOS (ABSOLUTE): 0.1 x10E3/uL (ref 0.0–0.4)
Eos: 2 %
HCV Ab: NONREACTIVE
HIV Screen 4th Generation wRfx: NONREACTIVE
Hematocrit: 38 % (ref 34.0–46.6)
Hemoglobin: 11.3 g/dL (ref 11.1–15.9)
Hepatitis B Surface Ag: NEGATIVE
Immature Grans (Abs): 0 x10E3/uL (ref 0.0–0.1)
Immature Granulocytes: 0 %
Lymphocytes Absolute: 1 x10E3/uL (ref 0.7–3.1)
Lymphs: 20 %
MCH: 23.7 pg — ABNORMAL LOW (ref 26.6–33.0)
MCHC: 29.7 g/dL — ABNORMAL LOW (ref 31.5–35.7)
MCV: 80 fL (ref 79–97)
Monocytes Absolute: 0.4 x10E3/uL (ref 0.1–0.9)
Monocytes: 9 %
Neutrophils Absolute: 3.4 x10E3/uL (ref 1.4–7.0)
Neutrophils: 69 %
Platelets: 254 x10E3/uL (ref 150–450)
RBC: 4.77 x10E6/uL (ref 3.77–5.28)
RDW: 12.8 % (ref 11.7–15.4)
RPR Ser Ql: REACTIVE — AB
Rh Factor: NEGATIVE
Rubella Antibodies, IGG: 1.19 {index} (ref >0.99)
WBC: 4.9 x10E3/uL (ref 3.4–10.8)

## 2024-04-07 LAB — HCV INTERPRETATION

## 2024-04-07 LAB — HEMOGLOBIN A1C
Est. average glucose Bld gHb Est-mCnc: 94 mg/dL
Hgb A1c MFr Bld: 4.9 % (ref 4.8–5.6)

## 2024-04-07 LAB — RPR, QUANT+TP ABS (REFLEX)
Rapid Plasma Reagin, Quant: 1:2 {titer} — ABNORMAL HIGH
T Pallidum Abs: REACTIVE — AB

## 2024-04-08 ENCOUNTER — Ambulatory Visit: Payer: Self-pay | Admitting: Obstetrics and Gynecology

## 2024-04-08 DIAGNOSIS — O98119 Syphilis complicating pregnancy, unspecified trimester: Secondary | ICD-10-CM | POA: Insufficient documentation

## 2024-04-08 DIAGNOSIS — Z3480 Encounter for supervision of other normal pregnancy, unspecified trimester: Secondary | ICD-10-CM

## 2024-04-10 ENCOUNTER — Other Ambulatory Visit: Payer: Self-pay | Admitting: *Deleted

## 2024-04-10 ENCOUNTER — Telehealth: Payer: Self-pay | Admitting: *Deleted

## 2024-04-10 NOTE — Telephone Encounter (Signed)
 RTC from Joane Shine, [facility] regarding pt with +RPR, 1:2 titer. Mr. Shine inquiring about pt hx and chain of events leading up to dx. Advised Mr. Shine that Ms. Folino did not have any previous hx of syphilis. Informed Mr. Shine that pt was first seen on 03/10/24 for a nurse visit for OB Intake and US  and that she was next seen on 04/05/24 for New OB visit with provider. Mr. Shine requested clarification of why testing was delayed to New OB appt. Informed him of our standard process of completing OB Intake with US  and deferring labs until after 10 wks as appropriate. Advised that pt was notified around 10 AM this morning of positive syphilis screen and of need to notify partner(s) and seek TX at [facility]. We discussed options of TX at [facility] vs [facility] location. Mr. Shine advised that each option is viable, but in the case of [facility], we have to send orders for TX, whereas in the case of [facility], the pt stays within our system. He advised whichever option is most convenient for the pt. All questions were answered and no further action was advised.

## 2024-04-11 LAB — CYTOLOGY - PAP
Adequacy: ABNORMAL
Comment: NEGATIVE

## 2024-04-16 LAB — PANORAMA PRENATAL TEST FULL PANEL:PANORAMA TEST PLUS 5 ADDITIONAL MICRODELETIONS: FETAL FRACTION: 7.7

## 2024-04-16 LAB — HORIZON CUSTOM: REPORT SUMMARY: POSITIVE — AB

## 2024-05-01 ENCOUNTER — Encounter: Payer: Self-pay | Admitting: Obstetrics and Gynecology

## 2024-05-01 ENCOUNTER — Encounter: Admitting: Obstetrics and Gynecology

## 2024-05-01 ENCOUNTER — Ambulatory Visit (INDEPENDENT_AMBULATORY_CARE_PROVIDER_SITE_OTHER): Admitting: Obstetrics and Gynecology

## 2024-05-01 VITALS — BP 110/71 | HR 67 | Wt 182.0 lb

## 2024-05-01 DIAGNOSIS — Z6791 Unspecified blood type, Rh negative: Secondary | ICD-10-CM

## 2024-05-01 DIAGNOSIS — O26892 Other specified pregnancy related conditions, second trimester: Secondary | ICD-10-CM | POA: Diagnosis not present

## 2024-05-01 DIAGNOSIS — Z3480 Encounter for supervision of other normal pregnancy, unspecified trimester: Secondary | ICD-10-CM | POA: Diagnosis not present

## 2024-05-01 DIAGNOSIS — O99212 Obesity complicating pregnancy, second trimester: Secondary | ICD-10-CM | POA: Diagnosis not present

## 2024-05-01 DIAGNOSIS — O99210 Obesity complicating pregnancy, unspecified trimester: Secondary | ICD-10-CM

## 2024-05-01 DIAGNOSIS — Z3A16 16 weeks gestation of pregnancy: Secondary | ICD-10-CM | POA: Diagnosis not present

## 2024-05-01 NOTE — Progress Notes (Deleted)
R

## 2024-05-01 NOTE — Progress Notes (Signed)
" ° °  PRENATAL VISIT NOTE  Subjective:  Michaela Barron is a 23 y.o. G3P0020 at [redacted]w[redacted]d being seen today for ongoing prenatal care.  She is currently monitored for the following issues for this low-risk pregnancy and has Supervision of other normal pregnancy, antepartum; Accidental hypodermic needlestick injury; Maternal obesity syndrome, antepartum; Rh negative state in antepartum period; and Syphilis affecting pregnancy, antepartum on their problem list.  Patient reports no complaints.  Contractions: Not present. Vag. Bleeding: None.   . Denies leaking of fluid.   The following portions of the patient's history were reviewed and updated as appropriate: allergies, current medications, past family history, past medical history, past social history, past surgical history and problem list.   Objective:   Vitals:   05/01/24 1353  BP: 110/71  Pulse: 67  Weight: 182 lb (82.6 kg)    Fetal Status:  Fetal Heart Rate (bpm): 157        General: Alert, oriented and cooperative. Patient is in no acute distress.  Skin: Skin is warm and dry. No rash noted.   Cardiovascular: Normal heart rate noted  Respiratory: Normal respiratory effort, no problems with respiration noted  Abdomen: Soft, gravid, appropriate for gestational age.  Pain/Pressure: Present     Pelvic: Cervical exam deferred        Extremities: Normal range of motion.  Edema: Trace  Mental Status: Normal mood and affect. Normal behavior. Normal judgment and thought content.      03/10/2024   11:18 AM  Depression screen PHQ 2/9  Decreased Interest 1  Down, Depressed, Hopeless 1  PHQ - 2 Score 2  Altered sleeping 1  Tired, decreased energy 0  Change in appetite 0  Feeling bad or failure about yourself  0  Trouble concentrating 0  Moving slowly or fidgety/restless 1  Suicidal thoughts 0  PHQ-9 Score 4      Data saved with a previous flowsheet row definition        03/10/2024   11:19 AM  GAD 7 : Generalized Anxiety Score   Nervous, Anxious, on Edge 1  Control/stop worrying 1  Worry too much - different things 1  Trouble relaxing 0  Restless 0  Easily annoyed or irritable 1  Afraid - awful might happen 0  Total GAD 7 Score 4    Assessment and Plan:  Pregnancy: G3P0020 at [redacted]w[redacted]d 1. Supervision of other normal pregnancy, antepartum (Primary) Patient is doing well without complaints AFP today Anatomy ultrasound scheduled 05/22/24 Reviewed carrier screen result of alpha thal carrier- patient declined genetic referral or partner testing Patient completed syphilis treatment last week  2. [redacted] weeks gestation of pregnancy   3. Maternal obesity syndrome, antepartum   4. Rh negative state in antepartum period Fetal rh positive- rhogam at 28 weeks  Preterm labor symptoms and general obstetric precautions including but not limited to vaginal bleeding, contractions, leaking of fluid and fetal movement were reviewed in detail with the patient. Please refer to After Visit Summary for other counseling recommendations.   No follow-ups on file.  Future Appointments  Date Time Provider Department Center  05/22/2024  8:00 AM [facility] PROVIDER 1 [facility]  05/22/2024  8:30 AM [facility] US1 [facility]  07/20/2024  4:00 PM Penumalli, Eduard SAUNDERS, MD [facility] None    Winton Felt, MD  "

## 2024-05-02 DIAGNOSIS — J069 Acute upper respiratory infection, unspecified: Secondary | ICD-10-CM | POA: Diagnosis not present

## 2024-05-04 LAB — AFP, SERUM, OPEN SPINA BIFIDA
AFP MoM: 1.49
AFP Value: 43.3 ng/mL
Gest. Age on Collection Date: 16 wk
Maternal Age At EDD: 24.7 a
OSBR Risk 1 IN: 2809
Test Results:: NEGATIVE
Weight: 182 [lb_av]

## 2024-05-22 ENCOUNTER — Ambulatory Visit: Attending: Obstetrics and Gynecology | Admitting: Obstetrics

## 2024-05-22 ENCOUNTER — Ambulatory Visit (HOSPITAL_BASED_OUTPATIENT_CLINIC_OR_DEPARTMENT_OTHER)

## 2024-05-22 ENCOUNTER — Other Ambulatory Visit: Payer: Self-pay | Admitting: *Deleted

## 2024-05-22 VITALS — BP 136/66 | HR 70

## 2024-05-22 DIAGNOSIS — Z363 Encounter for antenatal screening for malformations: Secondary | ICD-10-CM | POA: Insufficient documentation

## 2024-05-22 DIAGNOSIS — Z3480 Encounter for supervision of other normal pregnancy, unspecified trimester: Secondary | ICD-10-CM

## 2024-05-22 DIAGNOSIS — O26899 Other specified pregnancy related conditions, unspecified trimester: Secondary | ICD-10-CM

## 2024-05-22 DIAGNOSIS — O99210 Obesity complicating pregnancy, unspecified trimester: Secondary | ICD-10-CM

## 2024-05-22 DIAGNOSIS — O360120 Maternal care for anti-D [Rh] antibodies, second trimester, not applicable or unspecified: Secondary | ICD-10-CM

## 2024-05-22 DIAGNOSIS — O99212 Obesity complicating pregnancy, second trimester: Secondary | ICD-10-CM | POA: Insufficient documentation

## 2024-05-22 DIAGNOSIS — O98112 Syphilis complicating pregnancy, second trimester: Secondary | ICD-10-CM

## 2024-05-22 DIAGNOSIS — Z3A19 19 weeks gestation of pregnancy: Secondary | ICD-10-CM | POA: Diagnosis not present

## 2024-05-22 DIAGNOSIS — O98119 Syphilis complicating pregnancy, unspecified trimester: Secondary | ICD-10-CM

## 2024-05-22 DIAGNOSIS — E669 Obesity, unspecified: Secondary | ICD-10-CM

## 2024-05-22 DIAGNOSIS — O285 Abnormal chromosomal and genetic finding on antenatal screening of mother: Secondary | ICD-10-CM | POA: Insufficient documentation

## 2024-05-22 DIAGNOSIS — Z362 Encounter for other antenatal screening follow-up: Secondary | ICD-10-CM

## 2024-05-22 NOTE — Progress Notes (Signed)
 MFM Consult Note  Michaela Barron is currently at [redacted]w[redacted]d. She was seen today for a detailed fetal anatomy scan due to maternal obesity with a BMI of 34.6.    She completed treatment for a syphilis infection earlier in her current pregnancy.  She had a cell free DNA test earlier in her pregnancy which indicated a low risk for trisomy 32, 64, and 13. A female fetus is predicted.   Her Horizon screening test indicated that she is a carrier for alpha thalassemia.  Sonographic findings Single intrauterine pregnancy at 19w 0d. Fetal cardiac activity:  Observed and appears normal. Presentation: Breech. The views of the fetal anatomy were limited today due to the fetal position.  What was visualized today appeared within normal limits. Fetal biometry shows the estimated fetal weight of 0 lb 10 oz, 275 grams (53%). Amniotic fluid: Within normal limits.  MVP: 5.9 cm. Placenta: Anterior. Adnexa: No abnormality visualized. Cervical length: 4 cm.  The patient was informed that anomalies may be missed due to technical limitations. If the fetus is in a suboptimal position or maternal habitus is increased, visualization of the fetus in the maternal uterus may be impaired.  Carrier for Alpha Thalassemia  The patient was advised that alpha thalassemia is inherited in an autosomal recessive fashion.   She was advised to have her partner tested to determine if he is also a carrier for this condition.    Should he screen negative for alpha thalassemia, their child should not be at risk for inheriting this disease.  Should both parents be carriers for this condition, their child may have a 25% chance of inheriting the disease.    Should both parents be identified as carriers for this condition, she understands that an amniocentesis is available for definitive prenatal diagnosis.  Her partner stated that he has had many other kids in the past with other women and they were all fine.  He declined to be  screened to determine if he is also a carrier for this condition.    They were advised to have their baby tested after birth.  Due to her syphilis infection earlier in her pregnancy, a follow-up exam was scheduled in the third trimester to assess the fetal growth.  The patient stated that all of her questions were answered.   A total of 30 minutes was spent counseling and coordinating the care for this patient.  Greater than 50% of the time was spent in direct face-to-face contact.

## 2024-05-29 ENCOUNTER — Other Ambulatory Visit (HOSPITAL_COMMUNITY)
Admission: RE | Admit: 2024-05-29 | Discharge: 2024-05-29 | Disposition: A | Source: Ambulatory Visit | Attending: Obstetrics | Admitting: Obstetrics

## 2024-05-29 ENCOUNTER — Ambulatory Visit: Payer: Self-pay | Admitting: Obstetrics

## 2024-05-29 ENCOUNTER — Encounter: Payer: Self-pay | Admitting: Obstetrics

## 2024-05-29 VITALS — BP 133/83 | HR 79 | Wt 184.8 lb

## 2024-05-29 DIAGNOSIS — R87615 Unsatisfactory cytologic smear of cervix: Secondary | ICD-10-CM | POA: Diagnosis not present

## 2024-05-29 DIAGNOSIS — O26892 Other specified pregnancy related conditions, second trimester: Secondary | ICD-10-CM

## 2024-05-29 DIAGNOSIS — Z6791 Unspecified blood type, Rh negative: Secondary | ICD-10-CM | POA: Diagnosis not present

## 2024-05-29 DIAGNOSIS — O99212 Obesity complicating pregnancy, second trimester: Secondary | ICD-10-CM | POA: Diagnosis not present

## 2024-05-29 DIAGNOSIS — Z3480 Encounter for supervision of other normal pregnancy, unspecified trimester: Secondary | ICD-10-CM

## 2024-05-29 DIAGNOSIS — O98112 Syphilis complicating pregnancy, second trimester: Secondary | ICD-10-CM

## 2024-05-29 DIAGNOSIS — Z3A20 20 weeks gestation of pregnancy: Secondary | ICD-10-CM

## 2024-05-29 DIAGNOSIS — O98119 Syphilis complicating pregnancy, unspecified trimester: Secondary | ICD-10-CM

## 2024-05-29 DIAGNOSIS — O99210 Obesity complicating pregnancy, unspecified trimester: Secondary | ICD-10-CM

## 2024-05-29 NOTE — Progress Notes (Signed)
 Pt presents for rob. Pt needs a repeat pap. No questions or concerns at this time.

## 2024-05-29 NOTE — Progress Notes (Signed)
 Subjective:  Michaela Barron is a 24 y.o. G3P0020 at [redacted]w[redacted]d being seen today for ongoing prenatal care.  She is currently monitored for the following issues for this low-risk pregnancy and has Supervision of other normal pregnancy, antepartum; Accidental hypodermic needlestick injury; Maternal obesity syndrome, antepartum; Rh negative state in antepartum period; and Syphilis affecting pregnancy, antepartum on their problem list.  Patient reports no complaints.  Contractions: Not present. Vag. Bleeding: None.  Movement: Present. Denies leaking of fluid.   The following portions of the patient's history were reviewed and updated as appropriate: allergies, current medications, past family history, past medical history, past social history, past surgical history and problem list. Problem list updated.  Objective:   Vitals:   05/29/24 1440  BP: 133/83  Pulse: 79  Weight: 184 lb 12.8 oz (83.8 kg)    Fetal Status: Fetal Heart Rate (bpm): 150   Movement: Present     General:  Alert, oriented and cooperative. Patient is in no acute distress.  Skin: Skin is warm and dry. No rash noted.   Cardiovascular: Normal heart rate noted  Respiratory: Normal respiratory effort, no problems with respiration noted  Abdomen: Soft, gravid, appropriate for gestational age. Pain/Pressure: Present     Pelvic:  Cervical exam deferred        Extremities: Normal range of motion.  Edema: Trace (in the am)  Mental Status: Normal mood and affect. Normal behavior. Normal judgment and thought content.   Urinalysis:      Assessment and Plan:  Pregnancy: G3P0020 at [redacted]w[redacted]d  1. Supervision of other normal pregnancy, antepartum (Primary)  2. Encounter for repeat Papanicolaou smear of cervix due to previous unsatisfactory results Rx: - Cytology - PAP( [facility])  3. Rh negative state in antepartum period - Rhogam at 28 weeks and postpartum  4. Syphilis affecting pregnancy, antepartum  5. Maternal obesity  syndrome, antepartum    Preterm labor symptoms and general obstetric precautions including but not limited to vaginal bleeding, contractions, leaking of fluid and fetal movement were reviewed in detail with the patient. Please refer to After Visit Summary for other counseling recommendations.   Return in about 4 weeks (around 06/26/2024) for ROB.   Rudy Carlin LABOR, MD 05/29/2024

## 2024-05-31 LAB — CYTOLOGY - PAP: Diagnosis: NEGATIVE

## 2024-06-01 ENCOUNTER — Ambulatory Visit: Admission: EM | Admit: 2024-06-01 | Discharge: 2024-06-01 | Disposition: A | Discharge status: Home / Self Care

## 2024-06-01 ENCOUNTER — Other Ambulatory Visit (HOSPITAL_COMMUNITY): Payer: Self-pay

## 2024-06-01 ENCOUNTER — Encounter: Payer: Self-pay | Admitting: Emergency Medicine

## 2024-06-01 DIAGNOSIS — R41840 Attention and concentration deficit: Secondary | ICD-10-CM | POA: Insufficient documentation

## 2024-06-01 DIAGNOSIS — J101 Influenza due to other identified influenza virus with other respiratory manifestations: Secondary | ICD-10-CM | POA: Diagnosis not present

## 2024-06-01 DIAGNOSIS — E66811 Obesity, class 1: Secondary | ICD-10-CM | POA: Insufficient documentation

## 2024-06-01 DIAGNOSIS — K219 Gastro-esophageal reflux disease without esophagitis: Secondary | ICD-10-CM | POA: Insufficient documentation

## 2024-06-01 DIAGNOSIS — N939 Abnormal uterine and vaginal bleeding, unspecified: Secondary | ICD-10-CM | POA: Insufficient documentation

## 2024-06-01 DIAGNOSIS — J45909 Unspecified asthma, uncomplicated: Secondary | ICD-10-CM | POA: Insufficient documentation

## 2024-06-01 DIAGNOSIS — F418 Other specified anxiety disorders: Secondary | ICD-10-CM | POA: Insufficient documentation

## 2024-06-01 DIAGNOSIS — N946 Dysmenorrhea, unspecified: Secondary | ICD-10-CM | POA: Insufficient documentation

## 2024-06-01 DIAGNOSIS — Z91018 Allergy to other foods: Secondary | ICD-10-CM | POA: Insufficient documentation

## 2024-06-01 DIAGNOSIS — F332 Major depressive disorder, recurrent severe without psychotic features: Secondary | ICD-10-CM | POA: Insufficient documentation

## 2024-06-01 DIAGNOSIS — E739 Lactose intolerance, unspecified: Secondary | ICD-10-CM | POA: Insufficient documentation

## 2024-06-01 DIAGNOSIS — F322 Major depressive disorder, single episode, severe without psychotic features: Secondary | ICD-10-CM | POA: Insufficient documentation

## 2024-06-01 DIAGNOSIS — F330 Major depressive disorder, recurrent, mild: Secondary | ICD-10-CM | POA: Insufficient documentation

## 2024-06-01 DIAGNOSIS — E348 Other specified endocrine disorders: Secondary | ICD-10-CM | POA: Insufficient documentation

## 2024-06-01 DIAGNOSIS — H6092 Unspecified otitis externa, left ear: Secondary | ICD-10-CM | POA: Insufficient documentation

## 2024-06-01 LAB — POCT INFLUENZA A/B
Influenza A, POC: NEGATIVE
Influenza B, POC: POSITIVE — AB

## 2024-06-01 MED ORDER — OSELTAMIVIR PHOSPHATE 75 MG PO CAPS
75.0000 mg | ORAL_CAPSULE | Freq: Two times a day (BID) | ORAL | 0 refills | Status: AC
  Filled 2024-06-01: qty 10, 5d supply, fill #0

## 2024-06-01 MED ORDER — ALBUTEROL SULFATE (2.5 MG/3ML) 0.083% IN NEBU
2.5000 mg | INHALATION_SOLUTION | Freq: Four times a day (QID) | RESPIRATORY_TRACT | 12 refills | Status: AC | PRN
  Filled 2024-06-01: qty 75, 7d supply, fill #0

## 2024-06-01 MED ORDER — ALBUTEROL SULFATE HFA 108 (90 BASE) MCG/ACT IN AERS
2.0000 | INHALATION_SPRAY | RESPIRATORY_TRACT | 0 refills | Status: AC
  Filled 2024-06-01: qty 6.7, 25d supply, fill #0

## 2024-06-01 NOTE — ED Triage Notes (Signed)
 Pt presents c/o URI x 2 days. Pt states,  I tested pos for Flu B today. I have rally bad SOB and chest tightness. I have a migraine and a fever that won't break. Stuffy nose and really bad congested cough and my appetite is gone.  Pt denies diarrhea but does co emesis.

## 2024-06-01 NOTE — ED Provider Notes (Signed)
 " [facility]    CSN: [id number] Arrival date & time: 06/01/24  1713      History   Chief Complaint Chief Complaint  Patient presents with   URI    HPI Michaela Barron is a 24 y.o. female.   24 year old female presents urgent care with complaints of cough, shortness of breath, wheezing, congestion, body aches, fever, migraine headache.  Her symptoms started about 2 days ago.  She did test herself at home and is positive for flu B.  She has been taking Tylenol  for her fever.  She is also using her albuterol  nebulizing treatments and her albuterol  inhaler but reports that she has run out of both.  She is [redacted] weeks pregnant.  She is trying to stay hydrated but does not have much of an appetite.  She has had some vomiting as well.   URI Presenting symptoms: congestion and cough   Presenting symptoms: no ear pain, no fever and no sore throat   Associated symptoms: headaches and wheezing   Associated symptoms: no arthralgias     Past Medical History:  Diagnosis Date   Asthma    Migraine     Patient Active Problem List   Diagnosis Date Noted   Asthma 06/01/2024   Class 1 obesity 06/01/2024   Dysmenorrhea 06/01/2024   Food allergy 06/01/2024   Gastroesophageal reflux disease 06/01/2024   Lactose intolerance 06/01/2024   Mild recurrent major depression 06/01/2024   Mixed anxiety and depressive disorder 06/01/2024   Otitis externa of left ear 06/01/2024   Pineal gland cyst 06/01/2024   Poor concentration 06/01/2024   Severe major depression, single episode, without psychotic features (HCC) 06/01/2024   Severe recurrent major depression without psychotic features (HCC) 06/01/2024   Vaginal bleeding 06/01/2024   Syphilis affecting pregnancy, antepartum 04/08/2024   Maternal obesity syndrome, antepartum 04/05/2024   Rh negative state in antepartum period 04/05/2024   Accidental hypodermic needlestick injury 03/20/2024   Supervision of other normal pregnancy,  antepartum 03/10/2024   Gonorrhea 12/15/2021   Bartholin's gland abscess 12/11/2021   Chronic bilateral thoracic back pain 03/15/2017   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 Authorizing Provider  acetaminophen  (TYLENOL ) 500 MG tablet Take 500 mg by mouth every 6 (six) hours as needed.   Yes [provider]  albuterol  (PROVENTIL ) (2.5 MG/3ML) 0.083% nebulizer solution Take 3 mLs (2.5 mg total) by nebulization every 6 (six) hours as needed for wheezing or shortness of breath. 06/01/24  Yes Indio Santilli A, PA-C  guaifenesin (ROBITUSSIN) 100 MG/5ML syrup Take 200 mg by mouth 3 (three) times daily as needed for cough.   Yes [provider]  ondansetron  (ZOFRAN ) 4 MG tablet 1 tablet Orally Once a day; Duration: 10 days 12/07/23  Yes  [provider]  oseltamivir  (TAMIFLU ) 75 MG capsule Take 1 capsule (75 mg total) by mouth every 12 (twelve) hours. 06/01/24  Yes Lucero Auzenne A, PA-C  promethazine -dextromethorphan (PROMETHAZINE -DM) 6.25-15 MG/5ML syrup 5 mL as needed Orally every 6 hrs; Duration: 10 days 04/15/23  Yes [provider]  albuterol  (VENTOLIN  HFA) 108 (90 Base) MCG/ACT inhaler Inhale 2 puffs into the lungs every 4 (four) hours as needed 06/01/24   Daymeon Fischman A, PA-C  cetirizine (ZYRTEC ALLERGY) 10 MG tablet 1 tablet Orally Once a day; Duration: 30 day(s)    [provider]  Doxylamine -Pyridoxine  (DICLEGIS ) 10-10 MG TBEC Take 2 tablets by mouth at bedtime. If symptoms persist, add 1 tablet in the  morning and 1 tablet in the afternoon Patient not taking: Reported on 05/29/2024 03/21/24   Zina Jerilynn LABOR, MD  fluticasone  (FLONASE ) 50 MCG/ACT nasal spray 2 sprays in each nostril Nasally Once a day; Duration: 7 days    [provider]  ondansetron  (ZOFRAN -ODT) 4 MG disintegrating tablet Place 1 tablet (4 mg total) under the tongue 3 (three) times daily. Patient not taking: Reported on 05/29/2024 02/07/24     Prenatal Vit-Fe Fumarate-FA (PRENATAL MULTIVITAMIN) TABS tablet Take 1 tablet by mouth daily at 12 noon.    [provider]  promethazine  (PHENERGAN ) 25 MG tablet Take 1 tablet (25 mg total) by mouth every 6 (six) hours as needed for nausea or vomiting. Patient not taking: Reported on 05/29/2024 02/14/24     SUMAtriptan  (IMITREX ) 50 MG tablet Take 1 tablet (50 mg total) as needed, may take second dose at least 2 hours after first dose, up to 4 tablets per day as needed. Patient not taking: Reported on 05/29/2024 11/08/23       Family History Family History  Problem Relation Age of Onset   Hypertension Mother    Heart Problems Mother    Cancer Father        larynx    Social History Social History[1]   Allergies   Clarithromycin and Shellfish protein-containing drug products   Review of Systems Review of Systems  Constitutional:  Negative for chills and fever.  HENT:  Positive for congestion. Negative for ear pain and sore throat.   Eyes:  Negative for pain and visual disturbance.  Respiratory:  Positive for cough, chest tightness, shortness of breath and wheezing.   Cardiovascular:  Negative for chest pain and palpitations.  Gastrointestinal:  Negative for abdominal pain and vomiting.  Genitourinary:  Negative for dysuria and hematuria.  Musculoskeletal:  Negative for arthralgias and back pain.  Skin:  Negative for color change and rash.  Neurological:  Positive for headaches. Negative for seizures and syncope.  All other systems reviewed and are  negative.    Physical Exam Triage Vital Signs ED Triage Vitals  Encounter Vitals Group     BP 06/01/24 1727 119/78     Girls Systolic BP Percentile --      Girls Diastolic BP Percentile --      Boys Systolic BP Percentile --      Boys Diastolic BP Percentile --      Pulse Rate 06/01/24 1727 (!) 107     Resp 06/01/24 1727 18     Temp 06/01/24 1727 (!) 101.1 F (38.4 C)     Temp Source 06/01/24 1727 Oral     SpO2 06/01/24 1727 98 %     Weight 06/01/24 1727 184 lb 11.9 oz (83.8 kg)     Height --  Head Circumference --      Peak Flow --      Pain Score 06/01/24 1726 6     Pain Loc --      Pain Education --      Exclude from Growth Chart --    No data found.  Updated Vital Signs BP 119/78 (BP Location: Left Arm)   Pulse (!) 107   Temp (!) 101.1 F (38.4 C) (Oral)   Resp 18   Wt 184 lb 11.9 oz (83.8 kg)   LMP 01/03/2024 (Approximate)   SpO2 98%   BMI 36.08 kg/m   Visual Acuity Right Eye Distance:   Left Eye Distance:   Bilateral Distance:    Right Eye Near:   Left Eye Near:    Bilateral Near:     Physical Exam Vitals and nursing note reviewed.  Constitutional:      General: She is not in acute distress.    Appearance: She is well-developed.  HENT:     Head: Normocephalic and atraumatic.  Eyes:     Conjunctiva/sclera: Conjunctivae normal.  Cardiovascular:     Rate and Rhythm: Normal rate and regular rhythm.     Heart sounds: No murmur heard. Pulmonary:     Effort: Pulmonary effort is normal. No respiratory distress.     Breath sounds: Examination of the right-upper field reveals wheezing. Examination of the left-upper field reveals wheezing. Wheezing present. No decreased breath sounds or rhonchi.  Abdominal:     Palpations: Abdomen is soft.     Tenderness: There is no abdominal tenderness.  Musculoskeletal:        General: No swelling.     Cervical back: Neck supple.  Skin:    General: Skin is warm and dry.     Capillary Refill: Capillary refill  takes less than 2 seconds.  Neurological:     Mental Status: She is alert.  Psychiatric:        Mood and Affect: Mood normal.      UC Treatments / Results  Labs (all labs ordered are listed, but only abnormal results are displayed) Labs Reviewed  POCT INFLUENZA A/B - Abnormal; Notable for the following components:      Result Value   Influenza B, POC Positive (*)    All other components within normal limits    EKG   Radiology No results found.  Procedures Procedures (including critical care time)  Medications Ordered in UC Medications - No data to display  Initial Impression / Assessment and Plan / UC Course  I have reviewed the triage vital signs and the nursing notes.  Pertinent labs & imaging results that were available during my care of the patient were reviewed by me and considered in my medical decision making (see chart for details).     Influenza B   Home test positive for influenza B.  This is a viral infection.  This does not require antibiotic treatment.  We can prescribe Tamiflu  which is a medication that can help improve the symptoms and decrease the duration of symptoms.  This would be okay to take while pregnant.  We have given you a list of other medications that you can use over-the-counter while pregnant to help with the other symptoms.  Continue to use Tylenol  for fevers.  Make sure to rest and drink plenty of fluids.  The flu typically last 5 to 7 days.  Would avoid being around groups of people until you are 24 hours without a fever  without fever reducing medication.  Overall the physical exam findings and vital signs are reassuring.  If you feel that you are having no improvement in symptoms in the next several days or you are unable to stay hydrated then recommend going to the emergency room for further evaluation.  We also refilled albuterol  inhaler as well as albuterol  nebulizing solution.  Continue this as previously for wheezing or shortness of  breath.  Final Clinical Impressions(s) / UC Diagnoses   Final diagnoses:  Influenza B     Discharge Instructions      Home test positive for influenza B.  This is a viral infection.  This does not require antibiotic treatment.  We can prescribe Tamiflu  which is a medication that can help improve the symptoms and decrease the duration of symptoms.  This would be okay to take while pregnant.  We have given you a list of other medications that you can use over-the-counter while pregnant to help with the other symptoms.  Continue to use Tylenol  for fevers.  Make sure to rest and drink plenty of fluids.  The flu typically last 5 to 7 days.  Would avoid being around groups of people until you are 24 hours without a fever without fever reducing medication.  If you feel that you are having no improvement in symptoms in the next several days or you are unable to stay hydrated then recommend going to the emergency room for further evaluation.    ED Prescriptions     Medication Sig Dispense Auth. Provider   oseltamivir  (TAMIFLU ) 75 MG capsule Take 1 capsule (75 mg total) by mouth every 12 (twelve) hours. 10 capsule Teresa Almarie LABOR, PA-C   albuterol  (VENTOLIN  HFA) 108 (90 Base) MCG/ACT inhaler Inhale 2 puffs into the lungs every 4 (four) hours as needed 6.7 g Yasiel Goyne A, PA-C   albuterol  (PROVENTIL ) (2.5 MG/3ML) 0.083% nebulizer solution Take 3 mLs (2.5 mg total) by nebulization every 6 (six) hours as needed for wheezing or shortness of breath. 75 mL Teresa Almarie LABOR, [place]      PDMP not reviewed this encounter.    [1]  Social History Tobacco Use   Smoking status: Never    Passive exposure: Never   Smokeless tobacco: Never  Vaping Use   Vaping status: Former   Substances: Nicotine  Substance Use Topics   Alcohol use: Not Currently   Drug use: Never     Teresa Almarie LABOR, PA-C 06/01/24 1744  "

## 2024-06-01 NOTE — Discharge Instructions (Addendum)
 Home test positive for influenza B.  This is a viral infection.  This does not require antibiotic treatment.  We can prescribe Tamiflu  which is a medication that can help improve the symptoms and decrease the duration of symptoms.  This would be okay to take while pregnant.  We have given you a list of other medications that you can use over-the-counter while pregnant to help with the other symptoms.  Continue to use Tylenol  for fevers.  Make sure to rest and drink plenty of fluids.  The flu typically last 5 to 7 days.  Would avoid being around groups of people until you are 24 hours without a fever without fever reducing medication.  If you feel that you are having no improvement in symptoms in the next several days or you are unable to stay hydrated then recommend going to the emergency room for further evaluation.

## 2024-06-26 ENCOUNTER — Encounter: Payer: Self-pay | Admitting: Family Medicine

## 2024-07-20 ENCOUNTER — Ambulatory Visit: Admitting: Diagnostic Neuroimaging

## 2024-08-07 ENCOUNTER — Other Ambulatory Visit

## 2024-08-07 ENCOUNTER — Ambulatory Visit
